# Patient Record
Sex: Female | Born: 1995 | Race: White | Hispanic: No | Marital: Married | Smoking: Never smoker
Health system: Southern US, Community
[De-identification: ages and names within clinical notes are randomized; demographics above are authoritative.]

## PROBLEM LIST (undated history)

## (undated) DIAGNOSIS — J45909 Unspecified asthma, uncomplicated: Secondary | ICD-10-CM

## (undated) DIAGNOSIS — L309 Dermatitis, unspecified: Secondary | ICD-10-CM

## (undated) DIAGNOSIS — L509 Urticaria, unspecified: Secondary | ICD-10-CM

## (undated) DIAGNOSIS — I10 Essential (primary) hypertension: Secondary | ICD-10-CM

## (undated) HISTORY — DX: Urticaria, unspecified: L50.9

## (undated) HISTORY — DX: Dermatitis, unspecified: L30.9

## (undated) HISTORY — PX: BARIATRIC SURGERY: SHX1103

## (undated) HISTORY — DX: Unspecified asthma, uncomplicated: J45.909

---
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
## 2018-10-01 DIAGNOSIS — F411 Generalized anxiety disorder: Secondary | ICD-10-CM | POA: Insufficient documentation

## 2019-05-19 DIAGNOSIS — G932 Benign intracranial hypertension: Secondary | ICD-10-CM | POA: Insufficient documentation

## 2019-11-02 ENCOUNTER — Ambulatory Visit (HOSPITAL_COMMUNITY)
Admission: EM | Admit: 2019-11-02 | Discharge: 2019-11-02 | Disposition: A | Discharge status: Home / Self Care | Payer: BLUE CROSS/BLUE SHIELD

## 2019-11-02 DIAGNOSIS — F411 Generalized anxiety disorder: Secondary | ICD-10-CM | POA: Diagnosis not present
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
## 2019-11-02 NOTE — ED Notes (Signed)
No belongings in the locker
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
## 2019-11-02 NOTE — Progress Notes (Signed)
Jessica Andersen received her AVS, reviewed her appointments and her questions were answered. She was escorted to the lobby. No personal items to retrieved.

## 2019-11-04 ENCOUNTER — Other Ambulatory Visit: Payer: Self-pay

## 2019-11-04 ENCOUNTER — Ambulatory Visit (INDEPENDENT_AMBULATORY_CARE_PROVIDER_SITE_OTHER): Payer: BLUE CROSS/BLUE SHIELD | Admitting: Clinical

## 2019-11-04 DIAGNOSIS — Z87828 Personal history of other (healed) physical injury and trauma: Secondary | ICD-10-CM

## 2019-11-04 DIAGNOSIS — F411 Generalized anxiety disorder: Secondary | ICD-10-CM | POA: Diagnosis not present

## 2019-11-04 DIAGNOSIS — F331 Major depressive disorder, recurrent, moderate: Secondary | ICD-10-CM
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
## 2019-11-18 ENCOUNTER — Ambulatory Visit (INDEPENDENT_AMBULATORY_CARE_PROVIDER_SITE_OTHER): Payer: 59 | Admitting: Clinical

## 2019-11-18 ENCOUNTER — Other Ambulatory Visit: Payer: Self-pay

## 2019-11-18 DIAGNOSIS — F411 Generalized anxiety disorder: Secondary | ICD-10-CM | POA: Diagnosis not present

## 2019-11-18 DIAGNOSIS — Z87828 Personal history of other (healed) physical injury and trauma: Secondary | ICD-10-CM | POA: Diagnosis not present

## 2019-11-18 DIAGNOSIS — F331 Major depressive disorder, recurrent, moderate: Secondary | ICD-10-CM
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
## 2019-11-19 ENCOUNTER — Other Ambulatory Visit: Payer: Self-pay

## 2019-11-19 ENCOUNTER — Telehealth (INDEPENDENT_AMBULATORY_CARE_PROVIDER_SITE_OTHER): Payer: BLUE CROSS/BLUE SHIELD | Admitting: Psychiatry

## 2019-11-19 ENCOUNTER — Encounter (HOSPITAL_COMMUNITY): Payer: Self-pay | Admitting: Psychiatry

## 2019-11-19 DIAGNOSIS — F411 Generalized anxiety disorder: Secondary | ICD-10-CM

## 2019-11-19 DIAGNOSIS — F4001 Agoraphobia with panic disorder: Secondary | ICD-10-CM

## 2019-11-19 DIAGNOSIS — F331 Major depressive disorder, recurrent, moderate: Secondary | ICD-10-CM | POA: Diagnosis not present

## 2019-11-19 DIAGNOSIS — F900 Attention-deficit hyperactivity disorder, predominantly inattentive type: Secondary | ICD-10-CM

## 2019-11-19 DIAGNOSIS — F4310 Post-traumatic stress disorder, unspecified: Secondary | ICD-10-CM | POA: Diagnosis not present

## 2019-11-19 MED ORDER — PRAZOSIN HCL 1 MG PO CAPS
1.0000 mg | ORAL_CAPSULE | Freq: Every day | ORAL | 0 refills | Status: DC
Start: 2019-11-19 — End: 2019-12-16

## 2019-11-19 MED ORDER — DULOXETINE HCL 60 MG PO CPEP
60.0000 mg | ORAL_CAPSULE | Freq: Every day | ORAL | 3 refills | Status: DC
Start: 2019-11-19 — End: 2019-12-16

## 2019-11-19 MED ORDER — ZOLPIDEM TARTRATE 5 MG PO TABS: 5.0000 mg | ORAL_TABLET | Freq: Every evening | ORAL | 0 refills | Status: DC | PRN

## 2019-11-19 MED ORDER — CLONAZEPAM 0.5 MG PO TBDP
0.5000 mg | ORAL_TABLET | Freq: Two times a day (BID) | ORAL | 0 refills | Status: DC | PRN
Start: 2019-11-19 — End: 2019-12-16
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
## 2019-11-24 ENCOUNTER — Telehealth (HOSPITAL_COMMUNITY): Payer: Self-pay | Admitting: *Deleted

## 2019-11-24 NOTE — Telephone Encounter (Signed)
Patient called and stated that the 4 meds you started her on are working great that she is sleeping well and sees other improvements.  She stated that now she has no energy.  Do you want her to cut back on any of them? Please review and advise.

## 2019-11-24 NOTE — Telephone Encounter (Signed)
I think she should try to take only one 60 mg capsule of Cymbalta in the evening and if her energy level improves and mood does not deteriorate we may want to keep it at that dose.

## 2019-11-24 NOTE — Telephone Encounter (Signed)
I will let her know

## 2019-12-02 ENCOUNTER — Other Ambulatory Visit: Payer: Self-pay

## 2019-12-02 ENCOUNTER — Ambulatory Visit (INDEPENDENT_AMBULATORY_CARE_PROVIDER_SITE_OTHER): Payer: Managed Care, Other (non HMO) | Admitting: Clinical

## 2019-12-02 DIAGNOSIS — F4310 Post-traumatic stress disorder, unspecified: Secondary | ICD-10-CM

## 2019-12-02 DIAGNOSIS — F331 Major depressive disorder, recurrent, moderate: Secondary | ICD-10-CM

## 2019-12-02 DIAGNOSIS — F411 Generalized anxiety disorder: Secondary | ICD-10-CM
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
## 2019-12-16 ENCOUNTER — Ambulatory Visit (HOSPITAL_COMMUNITY): Payer: BLUE CROSS/BLUE SHIELD | Admitting: Clinical

## 2019-12-16 ENCOUNTER — Telehealth (INDEPENDENT_AMBULATORY_CARE_PROVIDER_SITE_OTHER): Payer: BLUE CROSS/BLUE SHIELD | Admitting: Psychiatry

## 2019-12-16 ENCOUNTER — Other Ambulatory Visit: Payer: Self-pay

## 2019-12-16 DIAGNOSIS — F4310 Post-traumatic stress disorder, unspecified: Secondary | ICD-10-CM

## 2019-12-16 DIAGNOSIS — F331 Major depressive disorder, recurrent, moderate: Secondary | ICD-10-CM

## 2019-12-16 DIAGNOSIS — F909 Attention-deficit hyperactivity disorder, unspecified type: Secondary | ICD-10-CM | POA: Diagnosis not present

## 2019-12-16 DIAGNOSIS — F411 Generalized anxiety disorder: Secondary | ICD-10-CM

## 2019-12-16 DIAGNOSIS — F4001 Agoraphobia with panic disorder: Secondary | ICD-10-CM

## 2019-12-16 MED ORDER — ZOLPIDEM TARTRATE 10 MG PO TABS
10.0000 mg | ORAL_TABLET | Freq: Every evening | ORAL | 2 refills | Status: DC | PRN
Start: 2019-12-16 — End: 2020-03-21

## 2019-12-16 MED ORDER — CLONAZEPAM 0.5 MG PO TBDP
0.5000 mg | ORAL_TABLET | Freq: Two times a day (BID) | ORAL | 1 refills | Status: DC | PRN
Start: 2019-12-16 — End: 2020-01-19

## 2019-12-16 MED ORDER — PRAZOSIN HCL 1 MG PO CAPS
1.0000 mg | ORAL_CAPSULE | Freq: Every day | ORAL | 2 refills | Status: DC
Start: 2019-12-16 — End: 2020-03-18

## 2019-12-16 MED ORDER — BUPROPION HCL ER (XL) 150 MG PO TB24
150.0000 mg | ORAL_TABLET | Freq: Every day | ORAL | 1 refills | Status: DC
Start: 2019-12-16 — End: 2020-01-19

## 2019-12-16 MED ORDER — DULOXETINE HCL 60 MG PO CPEP
120.0000 mg | ORAL_CAPSULE | Freq: Every day | ORAL | 2 refills | Status: DC
Start: 2019-12-16 — End: 2020-03-18
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
## 2019-12-17 ENCOUNTER — Ambulatory Visit (INDEPENDENT_AMBULATORY_CARE_PROVIDER_SITE_OTHER): Payer: BLUE CROSS/BLUE SHIELD | Admitting: Clinical

## 2019-12-17 DIAGNOSIS — F411 Generalized anxiety disorder: Secondary | ICD-10-CM | POA: Diagnosis not present

## 2019-12-17 DIAGNOSIS — F331 Major depressive disorder, recurrent, moderate: Secondary | ICD-10-CM

## 2019-12-17 DIAGNOSIS — F4310 Post-traumatic stress disorder, unspecified: Secondary | ICD-10-CM | POA: Diagnosis not present
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
## 2019-12-23 ENCOUNTER — Ambulatory Visit (HOSPITAL_COMMUNITY): Payer: BLUE CROSS/BLUE SHIELD | Admitting: Clinical

## 2019-12-30 ENCOUNTER — Ambulatory Visit (INDEPENDENT_AMBULATORY_CARE_PROVIDER_SITE_OTHER): Payer: 59 | Admitting: Clinical

## 2019-12-30 DIAGNOSIS — F411 Generalized anxiety disorder: Secondary | ICD-10-CM

## 2019-12-30 DIAGNOSIS — F4310 Post-traumatic stress disorder, unspecified: Secondary | ICD-10-CM | POA: Diagnosis not present

## 2019-12-30 DIAGNOSIS — F331 Major depressive disorder, recurrent, moderate: Secondary | ICD-10-CM | POA: Diagnosis not present

## 2019-12-30 NOTE — Progress Notes (Signed)
   THERAPIST PROGRESS NOTE  Session Time: 10am  Participation Level: Active  Behavioral Response: Unknown appearanceAlertAnxious and Depressed  Type of Therapy: Individual Therapy  Virtual Visit via Telephone Note  I connected with Jessica Andersen on 12/30/19 at 10:00 AM EST by telephone and verified that I am speaking with the correct person using two identifiers.  Location: Patient: Home Provider: Pt office   I discussed the limitations, risks, security and privacy concerns of performing an evaluation and management service by telephone and the availability of in person appointments. I also discussed with the patient that there may be a patient responsible charge related to this service. The patient expressed understanding and agreed to proceed.   I discussed the assessment and treatment plan with the patient. The patient was provided an opportunity to ask questions and all were answered. The patient agreed with the plan and demonstrated an understanding of the instructions.   The patient was advised to call back or seek an in-person evaluation if the symptoms worsen or if the condition fails to improve as anticipated.  I provided 45 minutes of non-face-to-face time during this encounter.  Treatment Goals addressed: Diagnosis  Interventions: Strength-based, Reframing and Other: psychoeducation  Summary: Jessica Andersen is a 24 y.o. female who presents with a history of depression, anxiety and trauma. During session, pt required a great deal or redirection as a she appeared to be distracted by caring for child. CSW addressed this and discussed with pt scheduling appointment during time where she has fewer distractions. Pt reports on days when she is feeling very overwhelmed, like today, she will ask her grandmother to assist her during her appointment time. Pt admits having difficulty adjusting to being a parent and discussed having no stress outlets. Todays session, consisted of pt  discussing her parenting challenges and addressing changes in her mood and behavior. Pt endorses "feeling alone, hopeless, emptiness and difficulty falling and staying asleep." Pt also reports she has been experiencing difficulty remembering to take her medication as prescribed. Pt states she is scheduled to have a spinal tap appointment on 11/23 and discussed having brain complication.   Suicidal/Homicidal: Pt reports she has been experiencing fleeting suicidal ideation but denies a plan or intent to harm herself. Pt reports the love of her family prevents her from acting on her thoughts. Pt encouraged to call 911 or go to closest emergency department in the event of an emergency. Pt reminded of the suicide prevention lifeline and encouraged to call if she experiences mental health emergency.  Therapist Response: Pt educated on mental health diagnosis and sxs associated with diagnosis. Discussed with pt potential benefits of participating in a mental health or parent support group to address lack of supports. Pt provided with information of mental health association of [place], triad mom groups and [facility] [place]. CSW demonstrated how to reframe negative thinking and role played with pt to assess her understanding of the skill. Pt encouraged to utilize community resources in the event of an emergency. CSW prompted pt to identify her strengths when she minimized many of her talents and abilities.  Plan: Return again in 2 weeks.  Diagnosis: Axis I: Major depressive disorder, recurrent, moderate Generalized anxiety disorder Post traumatic stress disorder    Axis II: No diagnosis    Jessica Slick, LCSW 12/30/2019

## 2020-01-06 ENCOUNTER — Ambulatory Visit (HOSPITAL_COMMUNITY): Payer: BLUE CROSS/BLUE SHIELD | Admitting: Clinical

## 2020-01-13 ENCOUNTER — Ambulatory Visit (INDEPENDENT_AMBULATORY_CARE_PROVIDER_SITE_OTHER): Payer: 59 | Admitting: Clinical

## 2020-01-13 ENCOUNTER — Other Ambulatory Visit: Payer: Self-pay

## 2020-01-13 DIAGNOSIS — F331 Major depressive disorder, recurrent, moderate: Secondary | ICD-10-CM

## 2020-01-13 DIAGNOSIS — F411 Generalized anxiety disorder: Secondary | ICD-10-CM

## 2020-01-13 DIAGNOSIS — F4310 Post-traumatic stress disorder, unspecified: Secondary | ICD-10-CM | POA: Diagnosis not present
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
## 2020-01-19 ENCOUNTER — Telehealth (INDEPENDENT_AMBULATORY_CARE_PROVIDER_SITE_OTHER): Payer: BLUE CROSS/BLUE SHIELD | Admitting: Psychiatry

## 2020-01-19 ENCOUNTER — Other Ambulatory Visit: Payer: Self-pay

## 2020-01-19 DIAGNOSIS — F330 Major depressive disorder, recurrent, mild: Secondary | ICD-10-CM | POA: Diagnosis not present

## 2020-01-19 DIAGNOSIS — F411 Generalized anxiety disorder: Secondary | ICD-10-CM | POA: Diagnosis not present

## 2020-01-19 DIAGNOSIS — F909 Attention-deficit hyperactivity disorder, unspecified type: Secondary | ICD-10-CM

## 2020-01-19 DIAGNOSIS — F4001 Agoraphobia with panic disorder: Secondary | ICD-10-CM

## 2020-01-19 DIAGNOSIS — F4310 Post-traumatic stress disorder, unspecified: Secondary | ICD-10-CM

## 2020-01-19 DIAGNOSIS — F331 Major depressive disorder, recurrent, moderate: Secondary | ICD-10-CM

## 2020-01-19 MED ORDER — OXCARBAZEPINE 150 MG PO TABS: 150.0000 mg | ORAL_TABLET | Freq: Two times a day (BID) | ORAL | 1 refills | Status: DC

## 2020-01-19 MED ORDER — BUPROPION HCL ER (XL) 150 MG PO TB24
150.0000 mg | ORAL_TABLET | Freq: Every day | ORAL | 2 refills | Status: DC
Start: 2020-02-15 — End: 2020-03-21

## 2020-01-19 MED ORDER — CLONAZEPAM 0.5 MG PO TBDP
0.5000 mg | ORAL_TABLET | Freq: Two times a day (BID) | ORAL | 1 refills | Status: DC | PRN
Start: 2020-02-15 — End: 2020-03-21
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
## 2020-01-20 ENCOUNTER — Ambulatory Visit (HOSPITAL_COMMUNITY): Payer: BLUE CROSS/BLUE SHIELD | Admitting: Clinical

## 2020-01-20 NOTE — Progress Notes (Unsigned)
Pt canceled today's appointment and request to have the appointment rescheduled.

## 2020-02-23 ENCOUNTER — Other Ambulatory Visit: Payer: Self-pay

## 2020-02-23 ENCOUNTER — Telehealth (HOSPITAL_COMMUNITY): Payer: BLUE CROSS/BLUE SHIELD | Admitting: Psychiatry

## 2020-03-09 ENCOUNTER — Encounter (HOSPITAL_COMMUNITY): Payer: Self-pay

## 2020-03-09 ENCOUNTER — Emergency Department (HOSPITAL_COMMUNITY): Payer: BLUE CROSS/BLUE SHIELD

## 2020-03-09 ENCOUNTER — Other Ambulatory Visit: Payer: Self-pay

## 2020-03-09 DIAGNOSIS — R079 Chest pain, unspecified: Secondary | ICD-10-CM | POA: Diagnosis not present

## 2020-03-09 DIAGNOSIS — Z5321 Procedure and treatment not carried out due to patient leaving prior to being seen by health care provider: Secondary | ICD-10-CM | POA: Insufficient documentation

## 2020-03-09 NOTE — ED Triage Notes (Signed)
Patient from home with chest pain, reports pain is intermittent, is currently on a holter monitor for same, EMS reports she was pain free entire ride then developed pain on arrival.  EMS 160/113 115 HR 20 RR 97% RA

## 2020-03-10 ENCOUNTER — Emergency Department (HOSPITAL_COMMUNITY)
Admission: EM | Admit: 2020-03-10 | Discharge: 2020-03-10 | Disposition: A | Discharge status: Home / Self Care | Payer: BLUE CROSS/BLUE SHIELD | Attending: Emergency Medicine | Admitting: Emergency Medicine

## 2020-03-10 LAB — BASIC METABOLIC PANEL
Anion gap: 10 (ref 5–15)
BUN: 8 mg/dL (ref 6–20)
CO2: 24 mmol/L (ref 22–32)
Calcium: 9.7 mg/dL (ref 8.9–10.3)
Chloride: 104 mmol/L (ref 98–111)
Creatinine, Ser: 0.65 mg/dL (ref 0.44–1.00)
GFR, Estimated: >60 mL/min (ref >60)
Glucose, Bld: 92 mg/dL (ref 70–99)
Potassium: 3.8 mmol/L (ref 3.5–5.1)
Sodium: 138 mmol/L (ref 135–145)

## 2020-03-10 LAB — CBC
HCT: 44.7 % (ref 36.0–46.0)
Hemoglobin: 14.6 g/dL (ref 12.0–15.0)
MCH: 27.8 pg (ref 26.0–34.0)
MCHC: 32.7 g/dL (ref 30.0–36.0)
MCV: 85 fL (ref 80.0–100.0)
Platelets: 300 10*3/uL (ref 150–400)
RBC: 5.26 MIL/uL — ABNORMAL HIGH (ref 3.87–5.11)
RDW: 13.5 % (ref 11.5–15.5)
WBC: 15.6 10*3/uL — ABNORMAL HIGH (ref 4.0–10.5)
nRBC: 0 % (ref 0.0–0.2)

## 2020-03-10 LAB — TROPONIN I (HIGH SENSITIVITY): Troponin I (High Sensitivity): 5 ng/L (ref <18)

## 2020-03-10 LAB — I-STAT BETA HCG BLOOD, ED (MC, WL, AP ONLY): I-stat hCG, quantitative: <5 m[IU]/mL (ref <5)

## 2020-03-17 ENCOUNTER — Telehealth (HOSPITAL_COMMUNITY): Payer: Self-pay | Admitting: *Deleted

## 2020-03-17 NOTE — Telephone Encounter (Signed)
Pt called requesting refills and transfer to new pharmacy, have added to snapshot, in [place]. Writer advised we can do that however pt needs to make an appointment as she was no show for last two appointments and has controls to fill. Pt now has an appointment on 03/21/20. Do you advise refilling or wait until appointment in 3 days. Didn't expect appointment so soon.

## 2020-03-18 ENCOUNTER — Other Ambulatory Visit (HOSPITAL_COMMUNITY): Payer: Self-pay | Admitting: Psychiatry

## 2020-03-18 MED ORDER — OXCARBAZEPINE 150 MG PO TABS: 150.0000 mg | ORAL_TABLET | Freq: Two times a day (BID) | ORAL | 1 refills | Status: DC

## 2020-03-18 MED ORDER — PRAZOSIN HCL 1 MG PO CAPS: 1.0000 mg | ORAL_CAPSULE | Freq: Every day | ORAL | 2 refills | Status: DC

## 2020-03-18 MED ORDER — DULOXETINE HCL 60 MG PO CPEP: 120.0000 mg | ORAL_CAPSULE | Freq: Every day | ORAL | 2 refills | Status: DC

## 2020-03-18 NOTE — Telephone Encounter (Signed)
Given hx of no shows I'd rather wait till Monday with any refills.

## 2020-03-21 ENCOUNTER — Other Ambulatory Visit: Payer: Self-pay

## 2020-03-21 ENCOUNTER — Telehealth (INDEPENDENT_AMBULATORY_CARE_PROVIDER_SITE_OTHER): Payer: BLUE CROSS/BLUE SHIELD | Admitting: Psychiatry

## 2020-03-21 DIAGNOSIS — F4001 Agoraphobia with panic disorder: Secondary | ICD-10-CM

## 2020-03-21 DIAGNOSIS — F330 Major depressive disorder, recurrent, mild: Secondary | ICD-10-CM

## 2020-03-21 DIAGNOSIS — F4310 Post-traumatic stress disorder, unspecified: Secondary | ICD-10-CM

## 2020-03-21 DIAGNOSIS — F411 Generalized anxiety disorder: Secondary | ICD-10-CM | POA: Diagnosis not present

## 2020-03-21 MED ORDER — BUPROPION HCL ER (XL) 150 MG PO TB24: 150.0000 mg | ORAL_TABLET | Freq: Every day | ORAL | 2 refills | Status: DC

## 2020-03-21 MED ORDER — OXCARBAZEPINE 300 MG PO TABS: 300.0000 mg | ORAL_TABLET | Freq: Two times a day (BID) | ORAL | 2 refills | Status: DC

## 2020-03-21 MED ORDER — ZOLPIDEM TARTRATE 10 MG PO TABS: 10.0000 mg | ORAL_TABLET | Freq: Every evening | ORAL | 2 refills | Status: DC | PRN

## 2020-03-21 MED ORDER — CLONAZEPAM 0.5 MG PO TBDP: 0.5000 mg | ORAL_TABLET | Freq: Two times a day (BID) | ORAL | 2 refills | Status: AC | PRN
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
## 2020-05-20 ENCOUNTER — Other Ambulatory Visit (HOSPITAL_COMMUNITY): Payer: Self-pay | Admitting: Psychiatry

## 2020-06-06 ENCOUNTER — Telehealth (HOSPITAL_COMMUNITY): Payer: BLUE CROSS/BLUE SHIELD | Admitting: Family

## 2020-11-03 ENCOUNTER — Telehealth (HOSPITAL_COMMUNITY): Payer: Self-pay | Admitting: *Deleted

## 2020-11-03 NOTE — Telephone Encounter (Signed)
Writer returned pt call regarding medication refills. She is a former pt of Dr. Hinton Dyer. Pt has not been seen since 03/21/20 after several no shows. Pt was seen by [facility] on 08/03/20 and referred to [facility]. Pt has been out of medications for at least 4 months and has multiple medical issues as well. Provider here is not comfortable refilling pt meds with this history and not having been since in almost 10 months. Writer gave pt information about [facility] as well as [facility]. Pt receptive and verbalizes understanding.

## 2021-10-19 IMAGING — CR DG CHEST 2V
2 series · 2 of 2 positions shown · non-contrast
Comparison: None.

CLINICAL DATA: Chest pain

EXAM:
CHEST - 2 VIEW

[chest pa]
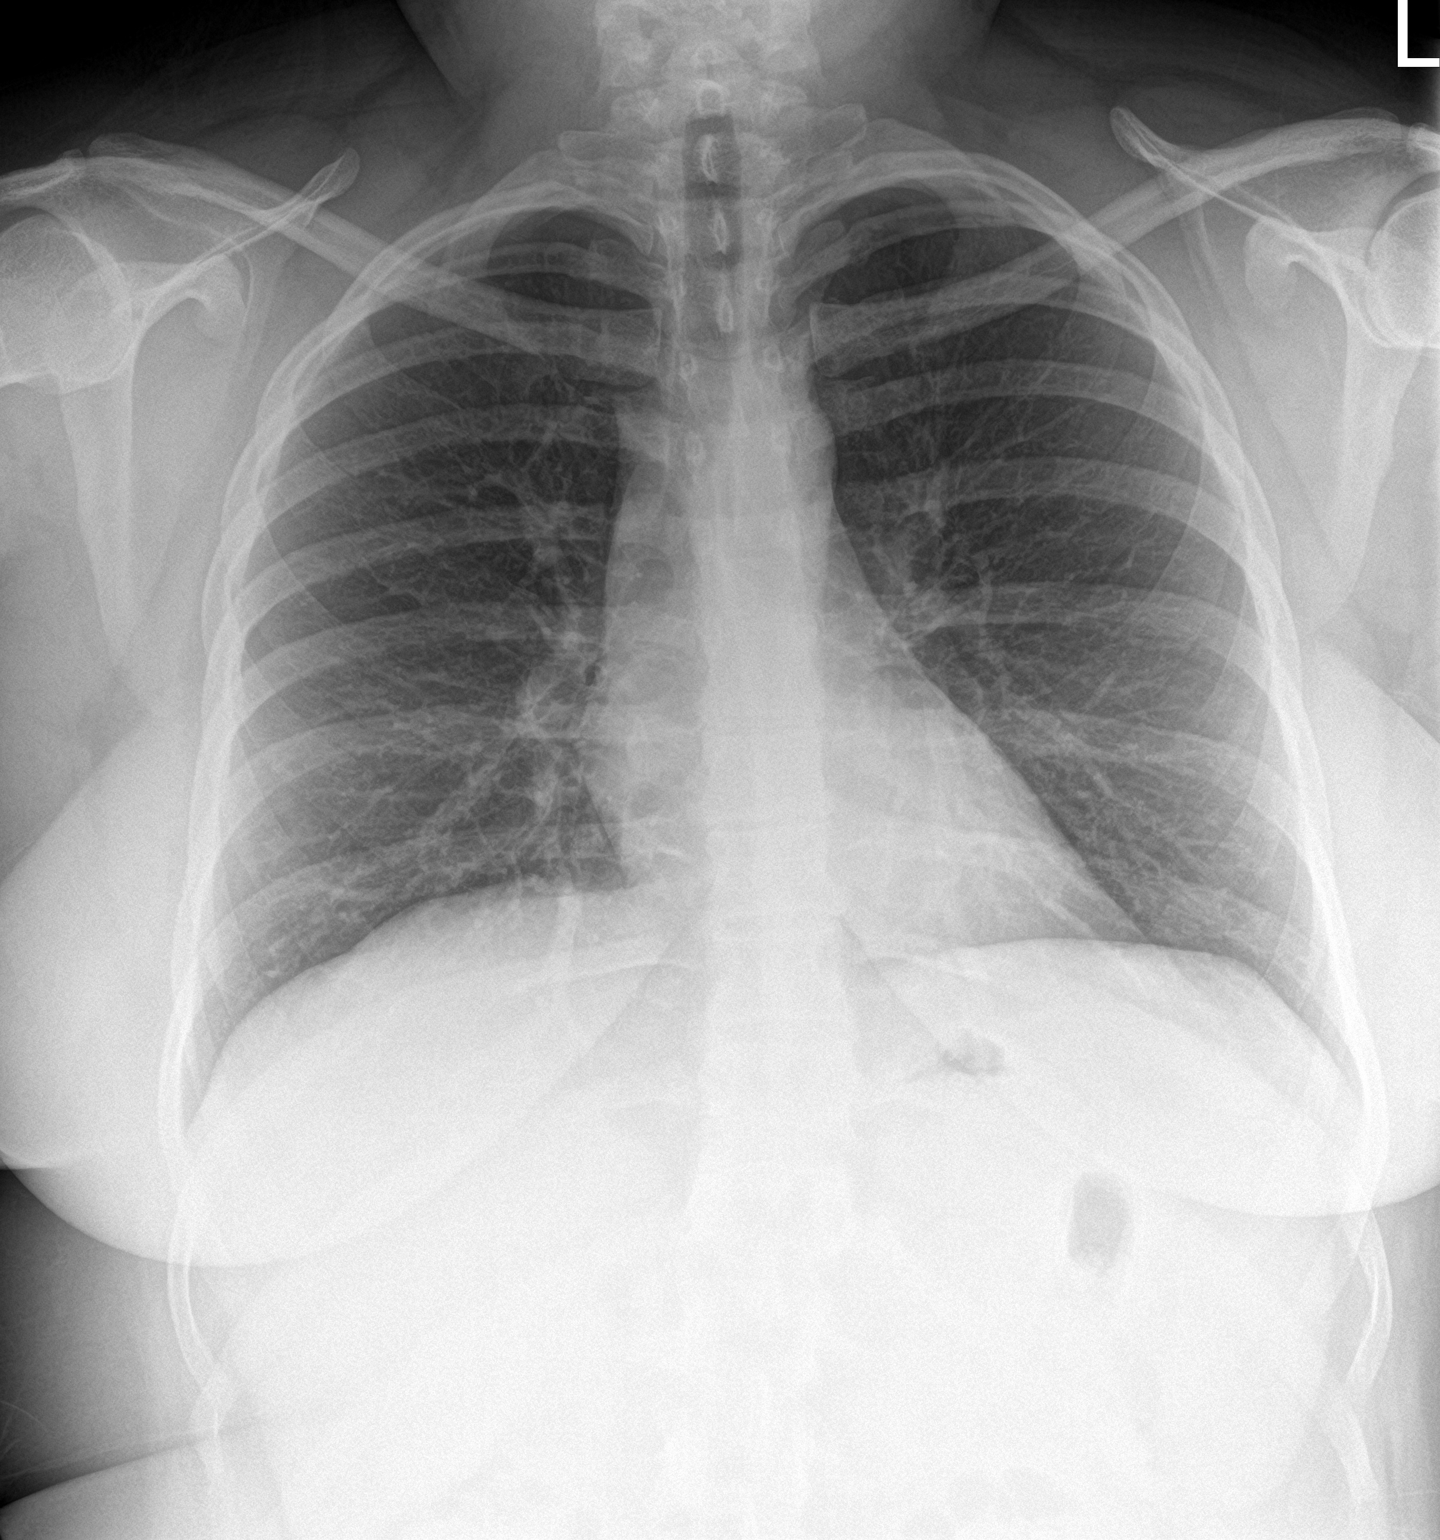

[chest lat]
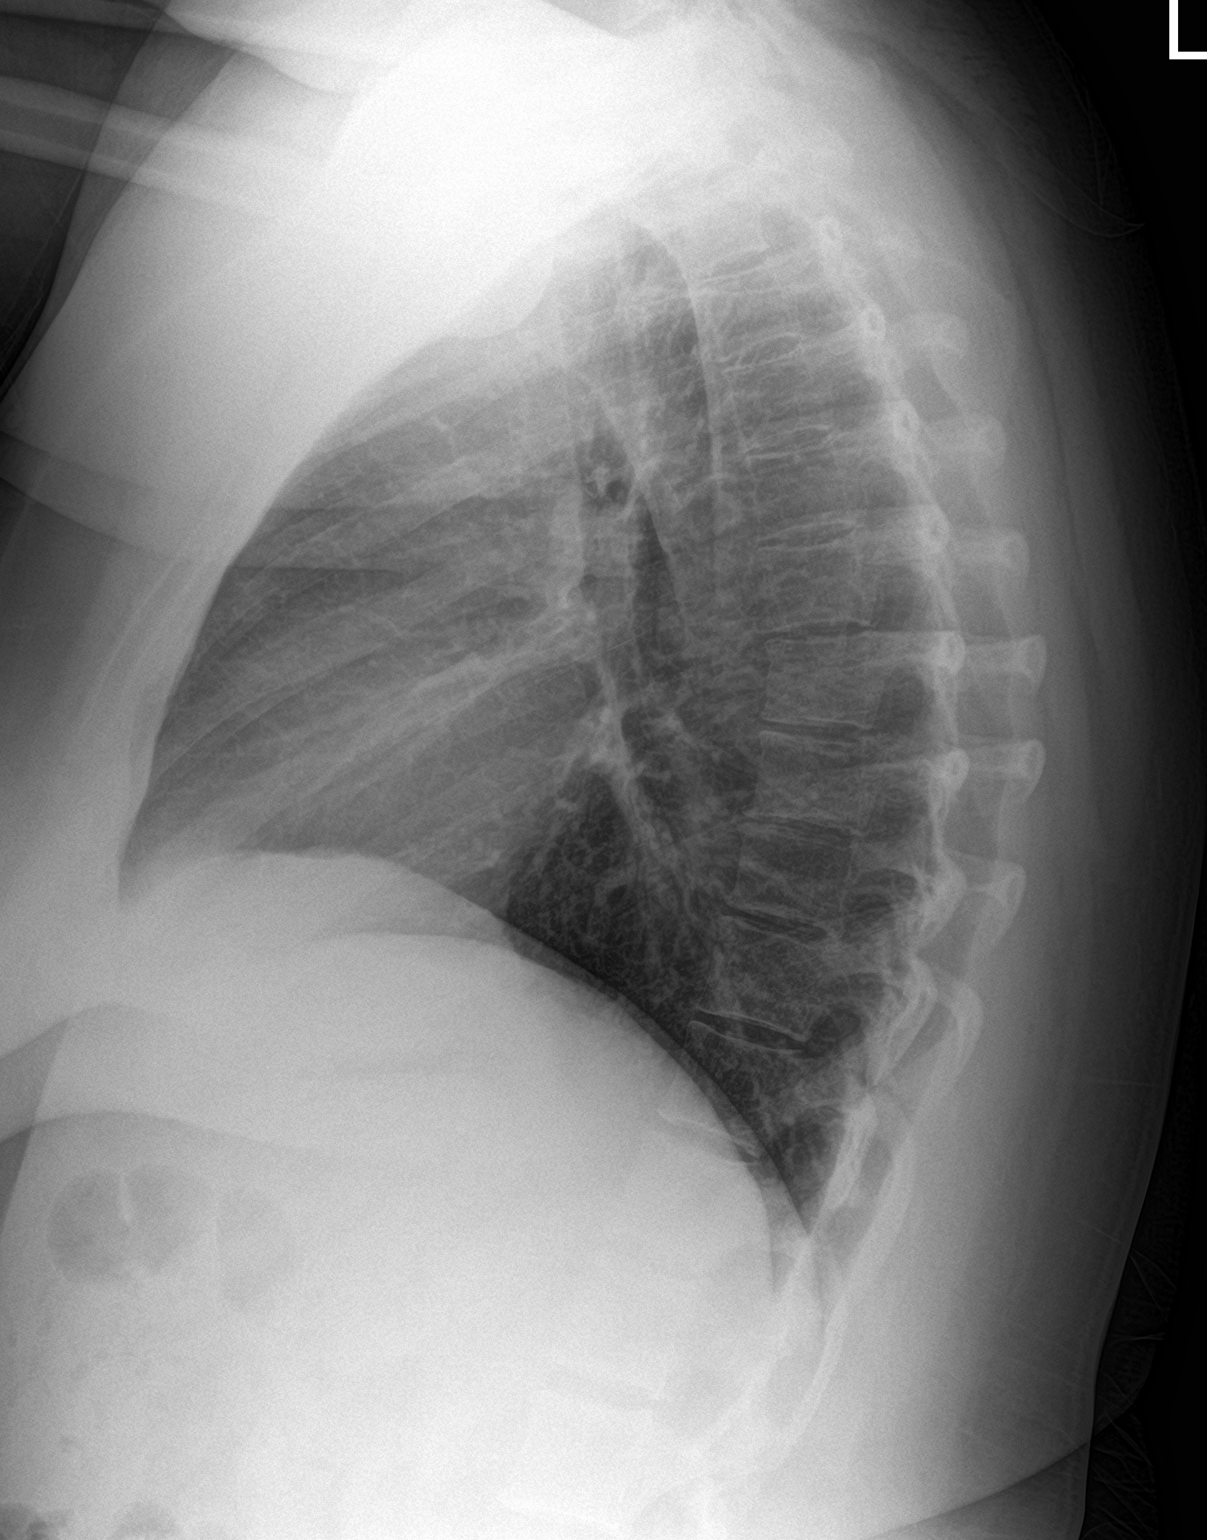

[2 of 2 positions shown; findings below may reference images not displayed]

FINDINGS: The heart size and mediastinal contours are within normal limits.
Both lungs are clear. The visualized skeletal structures are
unremarkable.
IMPRESSION: No active cardiopulmonary disease.

## 2022-02-26 NOTE — Progress Notes (Unsigned)
New Patient Note  RE: Jessica Andersen MRN: [id number] DOB: May 01, 1995 Date of Office Visit: 02/27/2022  Consult requested by: Loleta Dicker, FNP Primary care provider: Patient, No Pcp Per  Chief Complaint: Urticaria (Last allergist stopped doing xolair for hives and had trouble getting it. Wants to get a urine test to test for mold to see how bad it is. )  History of Present Illness: I had the pleasure of seeing Jessica Andersen for initial evaluation at the [facility] on 02/27/2022. She is a 27 y.o. female, who is referred here by Berton Bon, FNP for the evaluation of urticaria. She is accompanied today by her husband who provided/contributed to the history.   Patient was being followed by [facility] and she was on Xolair 300mg  every 4 weeks.  She was started last year and her prescription ran out 3 months ago. She had milder symptoms than before since she has been off it.   Rash started about 20+ years ago. This can occur anywhere on her body. Describes them as itchy, red, raised. Individual rashes lasts about 1-2 days. Sometimes they left some discoloration. Associated symptoms include: none.  Frequency of episodes: she used to break out on a daily basis. Now she has 1-2 hives once per week. Suspected triggers are unknown. Denies any fevers, chills, changes in medications, foods, personal care products or recent infections. She has tried the following therapies: prednisone with good benefit. Patient tried Singulair, famotidine, antihistamines with no benefit.  Systemic steroids yes. Currently on one daily antihistamine.  Previous work up includes: she had skin testing for environmental, food - which was positive to pollen only per patient report. No recent bloodwork.  No prior dermatology visit - no prior skin biopsy.  Patient is up to date with the following cancer screening tests: physical exam, pap smears.  Reviewed images on the phone - consistent with  hives.   09/02/2020 ER visit: " Allergic Reaction-Major  unknown allergen. pt has had 3 benadryl 2 hrs ago. pt stated is having difficulty swallowing, hives all over. swelling   Patient is a 27 year old female with past medical history of depression, asthma, idiopathic intracranial hypertension who presents with allergic reaction. Patient reports she has had worsening swelling and hives of her hands for the past month. Patient states that today it progressed and she now has some voice changes and some difficulty breathing. Patient also reports mild difficulty swallowing. Patient reports no known allergic triggers. Patient reports no fevers, chills, headache, chest pain, abdominal pain, nausea, vomiting. Patient reports no diarrhea. Patient reports no falls or injuries. Patient reports taking Benadryl at home without effect. Patient does note that she has been on steroids and took her last dose of prednisone today."  Assessment and Plan: Jessica Andersen is a 27 y.o. female with: Chronic urticaria Hives for 20+ years. Last year she was worked up by 23 and was started on Xolair 300mg  every 4 weeks with good benefit. Last injection was about 3 months ago and noticing some milder symptoms 1-2 times per week. Patient would like to restart Xolair. Tried OTC antihistamines, Singulair and famotidine with no benefit. Only prednisone helped in the past.  Requesting records from prior allergist. Will start PA restart Xolair 300mg  every 4 week injections. Start zyrtec (cetirizine) 10mg  or allegra 180mg  once a day and may take twice a day during flares.  If symptoms are not controlled or causes drowsiness let [employer] know. Avoid the following potential  triggers: alcohol, tight clothing, NSAIDs, hot showers and getting overheated. Get bloodwork - once I reviewed your records. Will mail lab orders to your home.   Other allergic rhinitis Some mild rhinoconjunctivitis symptoms and takes over-the-counter  antihistamines with good benefit.  Skin testing in the past was positive to pollen per patient report. Requesting records of last allergy test.  Start environmental control measures as below - for pollen.  Use over the counter antihistamines such as Zyrtec (cetirizine), Claritin (loratadine), Allegra (fexofenadine), or Xyzal (levocetirizine) daily as needed. May take twice a day during allergy flares. May switch antihistamines every few months. Start Ryaltris (olopatadine + mometasone nasal spray combination) 1-2 sprays per nostril twice a day. Sample given. This replaces your other nasal sprays. If this works well for you, then have [facility] ship the medication to your home - prescription already sent in.   Mild intermittent asthma without complication Diagnosed with asthma as a young child. Main triggers: dust and exertion. Uses albuterol less than once per month. Today's spirometry was normal. May use albuterol rescue inhaler 2 puffs every 4 to 6 hours as needed for shortness of breath, chest tightness, coughing, and wheezing. May use albuterol rescue inhaler 2 puffs 5 to 15 minutes prior to strenuous physical activities. Monitor frequency of use.   Return in about 3 months (around 05/29/2022).  Meds ordered this encounter  Medications   albuterol (VENTOLIN HFA) 108 (90 Base) MCG/ACT inhaler    Sig: Inhale 2 puffs into the lungs every 4 (four) hours as needed for wheezing or shortness of breath (coughing fits).    Dispense:  18 g    Refill:  1   Olopatadine-Mometasone (RYALTRIS) 665-25 MCG/ACT SUSP    Sig: Place 1-2 sprays into the nose in the morning and at bedtime.    Dispense:  29 g    Refill:  5    [phone]   Lab Orders  No laboratory test(s) ordered today    Other allergy screening: Asthma: yes Asthma since a child. Mainly triggered by exertion and dust. Uses albuterol less than once per month with good benefit.   Rhino conjunctivitis: yes Some itchy nose, sneezing,  rhinorrhea, itchy eyes. Takes OTC antihistamines prn with good benefit.  Skin testing in the past was positive to pollen per patient report.   Food allergy: no Medication allergy: yes Hymenoptera allergy: no Eczema: slightly on arms.  History of recurrent infections suggestive of immunodeficency: no  Diagnostics: Spirometry:  Tracings reviewed. Her effort: Good reproducible efforts. FVC: 4.60L FEV1: 3.57L, 117% predicted FEV1/FVC ratio: 78% Interpretation: Spirometry consistent with normal pattern.  Please see scanned spirometry results for details.   Past Medical History: Patient Active Problem List   Diagnosis Date Noted   Chronic urticaria 02/27/2022   Other allergic rhinitis 02/27/2022   Mild intermittent asthma without complication 02/27/2022   PTSD (post-traumatic stress disorder) 11/19/2019   Panic disorder with agoraphobia 11/19/2019   Benign intracranial hypertension 05/19/2019   GAD (generalized anxiety disorder) 10/01/2018   Morbid obesity due to excess calories (HCC) 10/01/2018   Major depressive disorder, recurrent episode, mild (HCC) 06/24/2015   Chronic migraine 04/26/2014   Attention-deficit hyperactivity disorder, unspecified type 11/13/2011   Past Medical History:  Diagnosis Date   Asthma    Eczema    Urticaria    Past Surgical History: History reviewed. No pertinent surgical history. Medication List:  Current Outpatient Medications  Medication Sig Dispense Refill   acetaminophen (TYLENOL) 500 MG tablet Take by mouth.  albuterol (VENTOLIN HFA) 108 (90 Base) MCG/ACT inhaler Inhale 2 puffs into the lungs every 4 (four) hours as needed for wheezing or shortness of breath (coughing fits). 18 g 1   baclofen (LIORESAL) 10 MG tablet Take by mouth.     BOTOX 200 units injection Provider to inject two hundred (200) units into the muscle every 3 months per PREEMPT protocol for chronic migraine.     clindamycin (CLINDAGEL) 1 % gel Apply topically.      fluticasone (FLOVENT HFA) 220 MCG/ACT inhaler Inhale into the lungs.     gabapentin (NEURONTIN) 100 MG capsule Take by mouth.     hydrocortisone 2.5 % cream Apply a small amount to affected area twice a day as needed     lisinopril (ZESTRIL) 5 MG tablet Take 5 mg by mouth daily.     metoCLOPramide (REGLAN) 10 MG tablet Take by mouth.     Olopatadine-Mometasone (RYALTRIS) [id number] MCG/ACT SUSP Place 1-2 sprays into the nose in the morning and at bedtime. 29 g 5   omeprazole (PRILOSEC) 40 MG capsule Take by mouth.     orphenadrine (NORFLEX) 100 MG tablet Take by mouth.     OXcarbazepine (TRILEPTAL PO) Take by mouth.     terbinafine (LAMISIL) 250 MG tablet Take 250 mg by mouth daily.     traZODone (DESYREL) 50 MG tablet Take 50 mg by mouth at bedtime.     UBRELVY 100 MG TABS Take 1 tablet by mouth 2 (two) times daily as needed.     Vitamin D, Ergocalciferol, 50000 units CAPS Take 1 capsule by mouth 2 (two) times a week.     buPROPion (WELLBUTRIN XL) 150 MG 24 hr tablet Take 1 tablet (150 mg total) by mouth daily. 30 tablet 2   clonazePAM (KLONOPIN) 0.5 MG disintegrating tablet Take 1 tablet (0.5 mg total) by mouth 2 (two) times daily as needed (anxiety). 60 tablet 2   DULoxetine (CYMBALTA) 60 MG capsule Take 2 capsules (120 mg total) by mouth at bedtime. 60 capsule 2   Oxcarbazepine (TRILEPTAL) 300 MG tablet Take 1 tablet (300 mg total) by mouth 2 (two) times daily. 60 tablet 2   prazosin (MINIPRESS) 1 MG capsule Take 1 capsule (1 mg total) by mouth at bedtime. 30 capsule 2   zolpidem (AMBIEN) 10 MG tablet Take 1 tablet (10 mg total) by mouth at bedtime as needed for sleep. 30 tablet 2   No current facility-administered medications for this visit.   Allergies: Allergies  Allergen Reactions   Other Hives    Environmental (Trees, grasses, pollen).   Baby Wipes Other (See Comments)    Burning sensation   Dexamethasone Other (See Comments)    "extreme burning sensation"   Icy Hot Hives    Penicillins Hives   Wound Dressings Hives    Super duty band-aid adhesive, not allergic to regular adhesive   Social History: Social History   Socioeconomic History   Marital status: Married    Spouse name: Not on file   Number of children: 1   Years of education: Not on file   Highest education level: Not on file  Occupational History   Not on file  Tobacco Use   Smoking status: Never   Smokeless tobacco: Never  Vaping Use   Vaping Use: Never used  Substance and Sexual Activity   Alcohol use: Not on file    Comment: social drinker   Drug use: Never   Sexual activity: Not on file  Other  Topics Concern   Not on file  Social History Narrative   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   Social Determinants of Health   Financial Resource Strain: Not on file  Food Insecurity: Not on file  Transportation Needs: Not on file  Physical Activity: Not on file  Stress: Not on file  Social Connections: Not on file   Lives in a house which was [place] in [place]. Smoking: denies Occupation: not employed  Environmental HistoryFreight forwarder in the house: yes Carpet in the family room: no Carpet in the bedroom: no Heating:  oil Cooling: central Pet: yes 6 cats, 1 dog  Family History: Family History  Problem Relation Age of Onset   Drug abuse Mother    Urticaria Brother    Asthma Brother    Eczema Cousin    Asthma Cousin    Review of Systems  Constitutional:  Negative for appetite change, chills, fever and unexpected weight change.  HENT:  Positive for congestion. Negative for rhinorrhea.   Eyes:  Negative for itching.  Respiratory:  Negative for cough, chest tightness, shortness of breath and wheezing.   Cardiovascular:  Negative for chest pain.  Gastrointestinal:  Negative for abdominal pain.  Genitourinary:  Negative for difficulty urinating.  Skin:  Positive for rash.  Allergic/Immunologic: Positive for environmental allergies. Negative for food allergies.  Neurological:   Positive for headaches.    Objective: BP 108/62   Pulse 88   Temp 98 F (36.7 C)   Resp 16   Ht 5' 2.5" (1.588 m)   Wt 195 lb 8 oz (88.7 kg)   SpO2 98%   BMI 35.19 kg/m  Body mass index is 35.19 kg/m. Physical Exam Vitals and nursing note reviewed.  Constitutional:      Appearance: Normal appearance. She is well-developed.  HENT:     Head: Normocephalic and atraumatic.     Right Ear: Tympanic membrane and external ear normal.     Left Ear: Tympanic membrane and external ear normal.     Nose: Congestion present.     Mouth/Throat:     Mouth: Mucous membranes are moist.     Pharynx: Oropharynx is clear.  Eyes:     Conjunctiva/sclera: Conjunctivae normal.  Cardiovascular:     Rate and Rhythm: Normal rate and regular rhythm.     Heart sounds: Normal heart sounds. No murmur heard.    No friction rub. No gallop.  Pulmonary:     Effort: Pulmonary effort is normal.     Breath sounds: Normal breath sounds. No wheezing, rhonchi or rales.  Musculoskeletal:     Cervical back: Neck supple.  Skin:    General: Skin is warm.     Findings: No rash.  Neurological:     Mental Status: She is alert and oriented to person, place, and time.  Psychiatric:        Behavior: Behavior normal.    The plan was reviewed with the patient/family, and all questions/concerned were addressed.  It was my pleasure to see Almas today and participate in her care. Please feel free to contact me with any questions or concerns.  Sincerely,  Rexene Alberts, DO Allergy & Immunology  [facility] office: [place] office: [phone]

## 2022-02-27 ENCOUNTER — Encounter: Payer: Self-pay | Admitting: Allergy

## 2022-02-27 ENCOUNTER — Ambulatory Visit (INDEPENDENT_AMBULATORY_CARE_PROVIDER_SITE_OTHER): Payer: BC Managed Care – PPO | Admitting: Allergy

## 2022-02-27 VITALS — BP 108/62 | HR 88 | Temp 98.0°F | Resp 16 | Ht 62.5 in | Wt 195.5 lb

## 2022-02-27 DIAGNOSIS — L508 Other urticaria: Secondary | ICD-10-CM | POA: Insufficient documentation

## 2022-02-27 DIAGNOSIS — J3089 Other allergic rhinitis: Secondary | ICD-10-CM | POA: Diagnosis not present

## 2022-02-27 DIAGNOSIS — J4520 Mild intermittent asthma, uncomplicated: Secondary | ICD-10-CM | POA: Diagnosis not present

## 2022-02-27 MED ORDER — ALBUTEROL SULFATE HFA 108 (90 BASE) MCG/ACT IN AERS: 2.0000 | INHALATION_SPRAY | RESPIRATORY_TRACT | 1 refills | Status: DC | PRN

## 2022-02-27 MED ORDER — RYALTRIS 665-25 MCG/ACT NA SUSP: 1.0000 | Freq: Two times a day (BID) | NASAL | 5 refills | Status: DC

## 2022-02-27 NOTE — Assessment & Plan Note (Signed)
Diagnosed with asthma as a young child. Main triggers: dust and exertion. Uses albuterol less than once per month. Today's spirometry was normal. May use albuterol rescue inhaler 2 puffs every 4 to 6 hours as needed for shortness of breath, chest tightness, coughing, and wheezing. May use albuterol rescue inhaler 2 puffs 5 to 15 minutes prior to strenuous physical activities. Monitor frequency of use.

## 2022-02-27 NOTE — Patient Instructions (Addendum)
Requesting records from your prior allergist. Will start paperwork to restart Xolair injections. Tammy will be in touch with you regarding this.   Hives: Start zyrtec (cetirizine) 10mg  or allegra 180mg  once a day and may take twice a day during flares.  If symptoms are not controlled or causes drowsiness let us know. Avoid the following potential triggers: alcohol, tight clothing, NSAIDs, hot showers and getting overheated.  Get bloodwork - once I reviewed your records. Will mail lab orders to your home.  We are ordering labs, so please allow 1-2 weeks for the results to come back. With the newly implemented Cures Act, the labs might be visible to you at the same time that they become visible to me. However, I will not address the results until all of the results are back, so please be patient.    Asthma Normal breathing test today. May use albuterol rescue inhaler 2 puffs every 4 to 6 hours as needed for shortness of breath, chest tightness, coughing, and wheezing. May use albuterol rescue inhaler 2 puffs 5 to 15 minutes prior to strenuous physical activities. Monitor frequency of use.   Allergic rhinitis Start environmental control measures as below - for pollen.  Use over the counter antihistamines such as Zyrtec (cetirizine), Claritin (loratadine), Allegra (fexofenadine), or Xyzal (levocetirizine) daily as needed. May take twice a day during allergy flares. May switch antihistamines every few months. Start Ryaltris (olopatadine + mometasone nasal spray combination) 1-2 sprays per nostril twice a day. Sample given. This replaces your other nasal sprays. If this works well for you, then have [facility] ship the medication to your home - prescription already sent in.   Follow up in 3 months or sooner if needed.    Reducing Pollen Exposure Pollen seasons: trees (spring), grass (summer) and ragweed/weeds (fall). Keep windows closed in your home and car to lower pollen exposure.  Install air  conditioning in the bedroom and throughout the house if possible.  Avoid going out in dry windy days - especially early morning. Pollen counts are highest between 5 - 10 AM and on dry, hot and windy days.  Save outside activities for late afternoon or after a heavy rain, when pollen levels are lower.  Avoid mowing of grass if you have grass pollen allergy. Be aware that pollen can also be transported indoors on people and pets.  Dry your clothes in an automatic dryer rather than hanging them outside where they might collect pollen.  Rinse hair and eyes before bedtime.

## 2022-02-27 NOTE — Assessment & Plan Note (Signed)
Hives for 20+ years. Last year she was worked up by [employer] and was started on Xolair 300mg  every 4 weeks with good benefit. Last injection was about 3 months ago and noticing some milder symptoms 1-2 times per week. Patient would like to restart Xolair. Tried OTC antihistamines, Singulair and famotidine with no benefit. Only prednisone helped in the past.  Requesting records from prior allergist. Will start PA restart Xolair 300mg  every 4 week injections. Start zyrtec (cetirizine) 10mg  or allegra 180mg  once a day and may take twice a day during flares.  If symptoms are not controlled or causes drowsiness let us know. Avoid the following potential triggers: alcohol, tight clothing, NSAIDs, hot showers and getting overheated. Get bloodwork - once I reviewed your records. Will mail lab orders to your home.

## 2022-02-27 NOTE — Assessment & Plan Note (Signed)
Some mild rhinoconjunctivitis symptoms and takes over-the-counter antihistamines with good benefit.  Skin testing in the past was positive to pollen per patient report. Requesting records of last allergy test.  Start environmental control measures as below - for pollen.  Use over the counter antihistamines such as Zyrtec (cetirizine), Claritin (loratadine), Allegra (fexofenadine), or Xyzal (levocetirizine) daily as needed. May take twice a day during allergy flares. May switch antihistamines every few months. Start Ryaltris (olopatadine + mometasone nasal spray combination) 1-2 sprays per nostril twice a day. Sample given. This replaces your other nasal sprays. If this works well for you, then have [facility] ship the medication to your home - prescription already sent in.

## 2022-03-05 ENCOUNTER — Telehealth: Payer: Self-pay

## 2022-03-05 ENCOUNTER — Encounter: Payer: Self-pay | Admitting: Allergy

## 2022-03-05 DIAGNOSIS — L508 Other urticaria: Secondary | ICD-10-CM

## 2022-03-05 MED ORDER — AZELASTINE-FLUTICASONE 137-50 MCG/ACT NA SUSP: 1.0000 | Freq: Two times a day (BID) | NASAL | 5 refills | Status: DC

## 2022-03-05 NOTE — Progress Notes (Signed)
Reviewed notes from [facility]. Date of service: multiple. See scanned notes for full documentation. Patient was seen by [facility] for asthma, allergic rhinitis, eoe, hives.  Allergy testing positive to dust mites and trees. On AIT briefly from 2011 to 2012 (T-G-W and Dm-D-M).

## 2022-03-05 NOTE — Telephone Encounter (Signed)
Called patient - DPR verified - LMVOM advising of provider notation below. Patient advised to contact office or send myChart message advising which option she would like to do.

## 2022-03-05 NOTE — Addendum Note (Signed)
Addended by: Garnet Sierras on: 03/05/2022 04:51 PM   Modules accepted: Orders

## 2022-03-05 NOTE — Telephone Encounter (Signed)
Start dymista (fluticasone + azelastine nasal spray combination) 1 spray per nostril twice a day.  Rx sent in.

## 2022-03-05 NOTE — Telephone Encounter (Signed)
Patient's office notes from [place] have been received and given to provider for review.

## 2022-03-05 NOTE — Telephone Encounter (Signed)
Please call patient and let her know that I reviewed notes and ordered labs for her to draw.  Please mail to her home or she can get drawn in our [place] office.

## 2022-03-05 NOTE — Telephone Encounter (Signed)
Per previous allergy office - [facility] [place] - patient has only tried [facility]ort Allergy 24 HR 55 mcg/act Nasal Aerosol 2 sprays each nostril daily.

## 2022-03-05 NOTE — Addendum Note (Signed)
Addended by: Garnet Sierras on: 03/05/2022 04:52 PM   Modules accepted: Orders

## 2022-03-05 NOTE — Telephone Encounter (Signed)
PA request received via CMM for Ryaltris 665-25MCG/ACT suspension  Key: [id number]  PA not submitted due to preferred alternatives of "azelastine-fluticasone, flunisolide, fluticasone, mometasone" not listed in patient chart.   Awaiting advise from provider about previous trial/failure

## 2022-03-15 ENCOUNTER — Telehealth: Payer: Self-pay | Admitting: *Deleted

## 2022-03-15 MED ORDER — AZELASTINE HCL 137 MCG/SPRAY NA SOLN: 1.0000 | Freq: Two times a day (BID) | NASAL | 5 refills | Status: DC

## 2022-03-15 MED ORDER — FLUTICASONE PROPIONATE 50 MCG/ACT NA SUSP: 1.0000 | Freq: Two times a day (BID) | NASAL | 5 refills | Status: DC

## 2022-03-15 NOTE — Telephone Encounter (Signed)
-----  Message from Garnet Sierras, DO sent at 02/27/2022  2:23 PM EST ----- Please start PA for Xolair 300mg  every 4 weeks for CIU. Patient was on it before. Thank you.

## 2022-03-15 NOTE — Telephone Encounter (Signed)
Please call patient and have her schedule a televisit regarding pregnancy and Xolair to discuss further.

## 2022-03-15 NOTE — Telephone Encounter (Signed)
Called patient to advise approval for Xolair and patient advised she found out she is pregnant. She has self administered Xolair in past for herr urticaria usually in thigh area. Patient wants to know if she can start back on therapy self admin she has been off for 3 months. Also wants alternate medication for her nose spray due to cost and ok per Dr Maudie Mercury to split meds to astelin and fluticasone to see if cost saving for patient.

## 2022-03-15 NOTE — Telephone Encounter (Signed)
Called patient - DPR verified - LMOVM regarding provider's notation below.  If patient returns call please advise of provider's notation and schedule a televisit if desired. Thanks.

## 2022-03-23 LAB — ALPHA-GAL PANEL
Allergen Lamb IgE: <0.1 kU/L
Beef IgE: <0.1 kU/L
IgE (Immunoglobulin E), Serum: 114 IU/mL (ref 6–495)
O215-IgE Alpha-Gal: <0.1 kU/L
Pork IgE: <0.1 kU/L

## 2022-03-23 LAB — COMPREHENSIVE METABOLIC PANEL
ALT: 24 IU/L (ref 0–32)
AST: 16 IU/L (ref 0–40)
Albumin/Globulin Ratio: 2 (ref 1.2–2.2)
Albumin: 4.1 g/dL (ref 4.0–5.0)
Alkaline Phosphatase: 84 IU/L (ref 44–121)
BUN/Creatinine Ratio: 18 (ref 9–23)
BUN: 9 mg/dL (ref 6–20)
Bilirubin Total: 0.5 mg/dL (ref 0.0–1.2)
CO2: 19 mmol/L — ABNORMAL LOW (ref 20–29)
Calcium: 9.5 mg/dL (ref 8.7–10.2)
Chloride: 104 mmol/L (ref 96–106)
Creatinine, Ser: 0.49 mg/dL — ABNORMAL LOW (ref 0.57–1.00)
Globulin, Total: 2.1 g/dL (ref 1.5–4.5)
Glucose: 101 mg/dL — ABNORMAL HIGH (ref 70–99)
Potassium: 3.7 mmol/L (ref 3.5–5.2)
Sodium: 136 mmol/L (ref 134–144)
Total Protein: 6.2 g/dL (ref 6.0–8.5)
eGFR: 134 mL/min/{1.73_m2} (ref >59)

## 2022-03-23 LAB — THYROID CASCADE PROFILE: TSH: 3.12 u[IU]/mL (ref 0.450–4.500)

## 2022-03-23 LAB — CBC WITH DIFFERENTIAL/PLATELET
Basophils Absolute: 0 10*3/uL (ref 0.0–0.2)
Basos: 0 %
EOS (ABSOLUTE): 0.2 10*3/uL (ref 0.0–0.4)
Eos: 2 %
Hematocrit: 36.7 % (ref 34.0–46.6)
Hemoglobin: 12.6 g/dL (ref 11.1–15.9)
Immature Grans (Abs): 0.1 10*3/uL (ref 0.0–0.1)
Immature Granulocytes: 1 %
Lymphocytes Absolute: 2.8 10*3/uL (ref 0.7–3.1)
Lymphs: 31 %
MCH: 30.4 pg (ref 26.6–33.0)
MCHC: 34.3 g/dL (ref 31.5–35.7)
MCV: 89 fL (ref 79–97)
Monocytes Absolute: 0.5 10*3/uL (ref 0.1–0.9)
Monocytes: 5 %
Neutrophils Absolute: 5.6 10*3/uL (ref 1.4–7.0)
Neutrophils: 61 %
Platelets: 209 10*3/uL (ref 150–450)
RBC: 4.14 x10E6/uL (ref 3.77–5.28)
RDW: 11.7 % (ref 11.7–15.4)
WBC: 9.1 10*3/uL (ref 3.4–10.8)

## 2022-03-23 LAB — C-REACTIVE PROTEIN: CRP: 5 mg/L (ref 0–10)

## 2022-03-23 LAB — TRYPTASE: Tryptase: 3.8 ug/L (ref 2.2–13.2)

## 2022-03-23 LAB — CHRONIC URTICARIA: cu index: <1 (ref <10)

## 2022-03-23 LAB — SEDIMENTATION RATE: Sed Rate: 10 mm/hr (ref 0–32)

## 2022-03-23 LAB — C3 AND C4
Complement C3, Serum: 126 mg/dL (ref 82–167)
Complement C4, Serum: 21 mg/dL (ref 12–38)

## 2022-03-23 LAB — ANA W/REFLEX: Anti Nuclear Antibody (ANA): NEGATIVE

## 2022-05-28 NOTE — Progress Notes (Deleted)
Follow Up Note  RE: Jessica Andersen MRN: [id number] DOB: 07/20/1995 Date of Office Visit: 05/29/2022  Referring provider: No ref. provider found Primary care provider: Patient, No Pcp Per  Chief Complaint: No chief complaint on file.  History of Present Illness: I had the pleasure of seeing Jessica Andersen for a follow up visit at the [facility] on 05/28/2022. She is a 27 y.o. female, who is being followed for chronic urticaria, allergic rhinitis and asthma. Her previous allergy office visit was on 02/27/2022 with Dr. Selena Batten. Today is a regular follow up visit.   I reviewed the bloodwork. Blood count, kidney function, liver function, electrolytes, thyroid, autoimmune screener, inflammation markers, chronic urticaria index (checks for autoantibodies that trigger mast cells), tryptase (checks for mast cell issues) and alpha gal (checks for red meat allergy) were all normal which is great.   If you are still interested in starting Xolair - please make a televisit prior to starting to discuss the risks and benefits as my nurse also told me that you are pregnant now.  Chronic urticaria Hives for 20+ years. Last year she was worked up by [employer] and was started on Xolair 300mg  every 4 weeks with good benefit. Last injection was about 3 months ago and noticing some milder symptoms 1-2 times per week. Patient would like to restart Xolair. Tried OTC antihistamines, Singulair and famotidine with no benefit. Only prednisone helped in the past.  Requesting records from prior allergist. Will start PA restart Xolair 300mg  every 4 week injections. Start zyrtec (cetirizine) 10mg  or allegra 180mg  once a day and may take twice a day during flares.  If symptoms are not controlled or causes drowsiness let us know. Avoid the following potential triggers: alcohol, tight clothing, NSAIDs, hot showers and getting overheated. Get bloodwork - once I reviewed your records. Will mail lab orders to  your home.    Other allergic rhinitis Some mild rhinoconjunctivitis symptoms and takes over-the-counter antihistamines with good benefit.  Skin testing in the past was positive to pollen per patient report. Requesting records of last allergy test.  Start environmental control measures as below - for pollen.  Use over the counter antihistamines such as Zyrtec (cetirizine), Claritin (loratadine), Allegra (fexofenadine), or Xyzal (levocetirizine) daily as needed. May take twice a day during allergy flares. May switch antihistamines every few months. Start Ryaltris (olopatadine + mometasone nasal spray combination) 1-2 sprays per nostril twice a day. Sample given. This replaces your other nasal sprays. If this works well for you, then have [facility] ship the medication to your home - prescription already sent in.    Mild intermittent asthma without complication Diagnosed with asthma as a young child. Main triggers: dust and exertion. Uses albuterol less than once per month. Today's spirometry was normal. May use albuterol rescue inhaler 2 puffs every 4 to 6 hours as needed for shortness of breath, chest tightness, coughing, and wheezing. May use albuterol rescue inhaler 2 puffs 5 to 15 minutes prior to strenuous physical activities. Monitor frequency of use.    Return in about 3 months (around 05/29/2022).  Assessment and Plan: Jessica Andersen is a 27 y.o. female with: No problem-specific Assessment & Plan notes found for this encounter.  No follow-ups on file.  No orders of the defined types were placed in this encounter.  Lab Orders  No laboratory test(s) ordered today    Diagnostics: Spirometry:  Tracings reviewed. Her effort: {Blank single:19197::"Good reproducible efforts.","It was hard to get consistent  efforts and there is a question as to whether this reflects a maximal maneuver.","Poor effort, data can not be interpreted."} FVC: ***L FEV1: ***L, ***% predicted FEV1/FVC ratio:  ***% Interpretation: {Blank single:19197::"Spirometry consistent with mild obstructive disease","Spirometry consistent with moderate obstructive disease","Spirometry consistent with severe obstructive disease","Spirometry consistent with possible restrictive disease","Spirometry consistent with mixed obstructive and restrictive disease","Spirometry uninterpretable due to technique","Spirometry consistent with normal pattern","No overt abnormalities noted given today's efforts"}.  Please see scanned spirometry results for details.  Skin Testing: {Blank single:19197::"Select foods","Environmental allergy panel","Environmental allergy panel and select foods","Food allergy panel","None","Deferred due to recent antihistamines use"}. *** Results discussed with patient/family.   Medication List:  Current Outpatient Medications  Medication Sig Dispense Refill   acetaminophen (TYLENOL) 500 MG tablet Take by mouth.     albuterol (VENTOLIN HFA) 108 (90 Base) MCG/ACT inhaler Inhale 2 puffs into the lungs every 4 (four) hours as needed for wheezing or shortness of breath (coughing fits). 18 g 1   Azelastine HCl 137 MCG/SPRAY SOLN Place 1 spray into the nose 2 (two) times daily. 30 mL 5   Azelastine-Fluticasone 137-50 MCG/ACT SUSP Place 1 spray into the nose in the morning and at bedtime. 23 g 5   baclofen (LIORESAL) 10 MG tablet Take by mouth.     BOTOX 200 units injection Provider to inject two hundred (200) units into the muscle every 3 months per PREEMPT protocol for chronic migraine.     buPROPion (WELLBUTRIN XL) 150 MG 24 hr tablet Take 1 tablet (150 mg total) by mouth daily. 30 tablet 2   clindamycin (CLINDAGEL) 1 % gel Apply topically.     clonazePAM (KLONOPIN) 0.5 MG disintegrating tablet Take 1 tablet (0.5 mg total) by mouth 2 (two) times daily as needed (anxiety). 60 tablet 2   DULoxetine (CYMBALTA) 60 MG capsule Take 2 capsules (120 mg total) by mouth at bedtime. 60 capsule 2   fluticasone  (FLONASE) 50 MCG/ACT nasal spray Place 1 spray into both nostrils 2 (two) times daily. 16 g 5   fluticasone (FLOVENT HFA) 220 MCG/ACT inhaler Inhale into the lungs.     gabapentin (NEURONTIN) 100 MG capsule Take by mouth.     hydrocortisone 2.5 % cream Apply a small amount to affected area twice a day as needed     lisinopril (ZESTRIL) 5 MG tablet Take 5 mg by mouth daily.     metoCLOPramide (REGLAN) 10 MG tablet Take by mouth.     omeprazole (PRILOSEC) 40 MG capsule Take by mouth.     orphenadrine (NORFLEX) 100 MG tablet Take by mouth.     OXcarbazepine (TRILEPTAL PO) Take by mouth.     Oxcarbazepine (TRILEPTAL) 300 MG tablet Take 1 tablet (300 mg total) by mouth 2 (two) times daily. 60 tablet 2   prazosin (MINIPRESS) 1 MG capsule Take 1 capsule (1 mg total) by mouth at bedtime. 30 capsule 2   terbinafine (LAMISIL) 250 MG tablet Take 250 mg by mouth daily.     traZODone (DESYREL) 50 MG tablet Take 50 mg by mouth at bedtime.     UBRELVY 100 MG TABS Take 1 tablet by mouth 2 (two) times daily as needed.     Vitamin D, Ergocalciferol, 50000 units CAPS Take 1 capsule by mouth 2 (two) times a week.     zolpidem (AMBIEN) 10 MG tablet Take 1 tablet (10 mg total) by mouth at bedtime as needed for sleep. 30 tablet 2   No current facility-administered medications for this visit.  Allergies: Allergies  Allergen Reactions   Other Hives    Environmental (Trees, grasses, pollen).   Baby Wipes Other (See Comments)    Burning sensation   Dexamethasone Other (See Comments)    "extreme burning sensation"   Icy Hot Hives   Penicillins Hives   Wound Dressings Hives    Super duty band-aid adhesive, not allergic to regular adhesive   I reviewed her past medical history, social history, family history, and environmental history and no significant changes have been reported from her previous visit.  Review of Systems  Constitutional:  Negative for appetite change, chills, fever and unexpected weight  change.  HENT:  Positive for congestion. Negative for rhinorrhea.   Eyes:  Negative for itching.  Respiratory:  Negative for cough, chest tightness, shortness of breath and wheezing.   Cardiovascular:  Negative for chest pain.  Gastrointestinal:  Negative for abdominal pain.  Genitourinary:  Negative for difficulty urinating.  Skin:  Positive for rash.  Allergic/Immunologic: Positive for environmental allergies. Negative for food allergies.  Neurological:  Positive for headaches.    Objective: There were no vitals taken for this visit. There is no height or weight on file to calculate BMI. Physical Exam Vitals and nursing note reviewed.  Constitutional:      Appearance: Normal appearance. She is well-developed.  HENT:     Head: Normocephalic and atraumatic.     Right Ear: Tympanic membrane and external ear normal.     Left Ear: Tympanic membrane and external ear normal.     Nose: Congestion present.     Mouth/Throat:     Mouth: Mucous membranes are moist.     Pharynx: Oropharynx is clear.  Eyes:     Conjunctiva/sclera: Conjunctivae normal.  Cardiovascular:     Rate and Rhythm: Normal rate and regular rhythm.     Heart sounds: Normal heart sounds. No murmur heard.    No friction rub. No gallop.  Pulmonary:     Effort: Pulmonary effort is normal.     Breath sounds: Normal breath sounds. No wheezing, rhonchi or rales.  Musculoskeletal:     Cervical back: Neck supple.  Skin:    General: Skin is warm.     Findings: No rash.  Neurological:     Mental Status: She is alert and oriented to person, place, and time.  Psychiatric:        Behavior: Behavior normal.    Previous notes and tests were reviewed. The plan was reviewed with the patient/family, and all questions/concerned were addressed.  It was my pleasure to see Jessica Andersen today and participate in her care. Please feel free to contact me with any questions or concerns.  Sincerely,  Wyline MoodYoon Milaya Hora, DO Allergy &  Immunology  [facility] [place] [place] office: [phone] [facility] office: [phone]

## 2022-05-29 ENCOUNTER — Ambulatory Visit: Payer: BC Managed Care – PPO | Admitting: Allergy

## 2022-05-29 DIAGNOSIS — L508 Other urticaria: Secondary | ICD-10-CM

## 2022-05-29 DIAGNOSIS — J4520 Mild intermittent asthma, uncomplicated: Secondary | ICD-10-CM

## 2022-05-29 DIAGNOSIS — J3089 Other allergic rhinitis: Secondary | ICD-10-CM

## 2022-08-28 ENCOUNTER — Encounter: Payer: Self-pay | Admitting: Allergy

## 2022-08-28 ENCOUNTER — Other Ambulatory Visit: Payer: Self-pay

## 2022-08-28 ENCOUNTER — Ambulatory Visit (INDEPENDENT_AMBULATORY_CARE_PROVIDER_SITE_OTHER): Payer: BC Managed Care – PPO | Admitting: Allergy

## 2022-08-28 VITALS — BP 114/62 | HR 95 | Temp 98.2°F | Resp 16 | Ht 62.75 in | Wt 200.2 lb

## 2022-08-28 DIAGNOSIS — J3089 Other allergic rhinitis: Secondary | ICD-10-CM

## 2022-08-28 DIAGNOSIS — L508 Other urticaria: Secondary | ICD-10-CM | POA: Diagnosis not present

## 2022-08-28 DIAGNOSIS — J4520 Mild intermittent asthma, uncomplicated: Secondary | ICD-10-CM

## 2022-08-28 NOTE — Progress Notes (Signed)
Follow Up Note  RE: Jessica Andersen MRN: [id number] DOB: 1996-01-14 Date of Office Visit: 08/28/2022  Referring provider: No ref. provider found Primary care provider: Patient, No Pcp Per  Chief Complaint: Urticaria (Had hives but are gone now. With xolair she is itchy and feels heat under her skin. Wants to get back on Xolair. )  History of Present Illness: I had the pleasure of seeing Jessica Andersen for a follow up visit at the [facility] on 08/28/2022. She is a 27 y.o. female, who is being followed for CIU, allergic rhinitis and asthma. Her previous allergy office visit was on 02/27/2022 with Dr. Selena Batten. Today is a regular follow up visit.  Chronic urticaria Patient had miscarriage 2 months ago.   Patient took Xolair injections for 2 times after the last visit and stopped once she found out she was pregnant as her hives didn't flare with the first pregnancy.   She started to break out again last week. She took benadryl and hydroxyzine (prescribed for anxiety) with some benefit.  She would like to go back on Xolair as it worked the best. Antihistamines were ineffective in the past. No recent prednisone use.   Allergic rhinitis Asymptomatic with no daily meds.  Mild intermittent asthma Denies any SOB, coughing, wheezing, chest tightness, nocturnal awakenings, ER/urgent care visits or prednisone use since the last visit.  Assessment and Plan: Jessica Andersen is a 27 y.o. female with: Chronic urticaria Past history - hives for 20+ years. Last year she was worked up by [employer] and was started on Xolair 300mg  every 4 weeks with good benefit. Last injection was about 3 months ago and noticing some milder symptoms 1-2 times per week. Patient would like to restart Xolair. Tried OTC antihistamines, Singulair and famotidine with no benefit. Only prednisone helped in the past. 2024 Bloodwork (CBC diff, CMP, TSH, ANA, ESR, CRP, CU, tryptase, Alpha gal) all normal.  Interim history  - only took Xolair for 2 months as she was pregnant but then had a miscarriage. Breaking out again.  Start zyrtec (cetirizine) 10mg  or allegra 180mg  once a day and may take twice a day during flares.  If symptoms are not controlled or causes drowsiness let us know. Start taking famotine 20mg  twice a day. May take hydroxyzine as needed for itching/hives/anxiety. Will restart on Xolair 300mg  every 4 weeks. Briefly discussed that if she gets pregnant again then okay to continue Xolair. The EXPECT study did not show increase in major birth defects or miscarriage in pregnant patients who were also on Xolair.  Avoid the following potential triggers: alcohol, tight clothing, NSAIDs, hot showers and getting overheated.  Mild intermittent asthma without complication Past history - diagnosed with asthma as a young child. Main triggers: dust and exertion. Uses albuterol less than once per month. 2024 spirometry was normal. May use albuterol rescue inhaler 2 puffs every 4 to 6 hours as needed for shortness of breath, chest tightness, coughing, and wheezing. May use albuterol rescue inhaler 2 puffs 5 to 15 minutes prior to strenuous physical activities. Monitor frequency of use.   Other allergic rhinitis Past history - some mild rhinoconjunctivitis symptoms and takes over-the-counter antihistamines with good benefit.  Allergy testing positive to dust mites and trees. On AIT briefly from 2011 to 2012 (T-G-W and Dm-D-M). Interim history - asymptomatic.  Continue environmental control measures. Use over the counter antihistamines such as Zyrtec (cetirizine), Claritin (loratadine), Allegra (fexofenadine), or Xyzal (levocetirizine) daily as needed. May take  twice a day during allergy flares. May switch antihistamines every few months.  Return in about 4 months (around 12/29/2022).  No orders of the defined types were placed in this encounter.  Lab Orders  No laboratory test(s) ordered today     Diagnostics: None.    Medication List:  Current Outpatient Medications  Medication Sig Dispense Refill   albuterol (VENTOLIN HFA) 108 (90 Base) MCG/ACT inhaler Inhale 2 puffs into the lungs every 4 (four) hours as needed for wheezing or shortness of breath (coughing fits). 18 g 1   Blood Glucose Monitoring Suppl (ACCU-CHEK GUIDE) w/Device KIT Use as directed by physician to monitor blood sugars     calcium acetate (PHOSLO) 667 MG capsule Take by mouth.     FLUoxetine (PROZAC) 40 MG capsule Take by mouth.     fluticasone (FLOVENT HFA) 220 MCG/ACT inhaler Inhale into the lungs.     glucose blood test strip Use to accu-chek guide test strips to monitor blood glucose 4 times daily     hydrocortisone 2.5 % cream Apply a small amount to affected area twice a day as needed     hydrOXYzine (VISTARIL) 25 MG capsule Take by mouth.     labetalol (NORMODYNE) 100 MG tablet Take by mouth.     lisinopril (ZESTRIL) 5 MG tablet Take 5 mg by mouth daily.     Magnesium 250 MG TABS Take 1 tablet by mouth daily.     metroNIDAZOLE (FLAGYL) 500 MG tablet Take 500 mg by mouth 2 (two) times daily.     orphenadrine (NORFLEX) 100 MG tablet Take by mouth.     phentermine (ADIPEX-P) 37.5 MG tablet Break the tablet in half. Take one half tablet 30 minutes before breakfast and the remaining half tablet 30 minutes before lunch.     topiramate (TOPAMAX) 25 MG tablet Take by mouth.     traZODone (DESYREL) 50 MG tablet Take 50 mg by mouth at bedtime.     Vitamin D, Ergocalciferol, 50000 units CAPS Take 1 capsule by mouth 2 (two) times a week.     clonazePAM (KLONOPIN) 0.5 MG disintegrating tablet Take 1 tablet (0.5 mg total) by mouth 2 (two) times daily as needed (anxiety). 60 tablet 2   fluticasone (FLONASE) 50 MCG/ACT nasal spray Place 1 spray into both nostrils 2 (two) times daily. (Patient not taking: Reported on 08/28/2022) 16 g 5   metoCLOPramide (REGLAN) 10 MG tablet Take by mouth. (Patient not taking: Reported on  08/28/2022)     Oxcarbazepine (TRILEPTAL) 300 MG tablet Take 1 tablet (300 mg total) by mouth 2 (two) times daily. 60 tablet 2   prazosin (MINIPRESS) 1 MG capsule Take 1 capsule (1 mg total) by mouth at bedtime. 30 capsule 2   No current facility-administered medications for this visit.   Allergies: Allergies  Allergen Reactions   Other Hives    Environmental (Trees, grasses, pollen).   Baby Wipes Other (See Comments)    Burning sensation   Dexamethasone Other (See Comments)    "extreme burning sensation"   Icy Hot Hives   Penicillins Hives   Wound Dressings Hives    Super duty band-aid adhesive, not allergic to regular adhesive   I reviewed her past medical history, social history, family history, and environmental history and no significant changes have been reported from her previous visit.  Review of Systems  Constitutional:  Negative for appetite change, chills, fever and unexpected weight change.  HENT:  Negative for congestion and rhinorrhea.  Eyes:  Negative for itching.  Respiratory:  Negative for cough, chest tightness, shortness of breath and wheezing.   Cardiovascular:  Negative for chest pain.  Gastrointestinal:  Negative for abdominal pain.  Genitourinary:  Negative for difficulty urinating.  Skin:  Positive for rash.  Allergic/Immunologic: Positive for environmental allergies. Negative for food allergies.    Objective: BP 114/62   Pulse 95   Temp 98.2 F (36.8 C)   Resp 16   Ht 5' 2.75" (1.594 m)   Wt 200 lb 4 oz (90.8 kg)   SpO2 97%   BMI 35.76 kg/m  Body mass index is 35.76 kg/m. Physical Exam Vitals and nursing note reviewed.  Constitutional:      Appearance: Normal appearance. She is well-developed.  HENT:     Head: Normocephalic and atraumatic.     Right Ear: Tympanic membrane and external ear normal.     Left Ear: Tympanic membrane and external ear normal.     Nose: Nose normal.     Mouth/Throat:     Mouth: Mucous membranes are moist.      Pharynx: Oropharynx is clear.  Eyes:     Conjunctiva/sclera: Conjunctivae normal.  Cardiovascular:     Rate and Rhythm: Normal rate and regular rhythm.     Heart sounds: Normal heart sounds. No murmur heard.    No friction rub. No gallop.  Pulmonary:     Effort: Pulmonary effort is normal.     Breath sounds: Normal breath sounds. No wheezing, rhonchi or rales.  Musculoskeletal:     Cervical back: Neck supple.  Skin:    General: Skin is warm.     Findings: No rash.  Neurological:     Mental Status: She is alert and oriented to person, place, and time.  Psychiatric:        Behavior: Behavior normal.    Previous notes and tests were reviewed. The plan was reviewed with the patient/family, and all questions/concerned were addressed.  It was my pleasure to see Jessica Andersen today and participate in her care. Please feel free to contact me with any questions or concerns.  Sincerely,  Wyline Mood, DO Allergy & Immunology  [facility] office: [phone] [facility] office: [phone]

## 2022-08-28 NOTE — Assessment & Plan Note (Signed)
Past history - some mild rhinoconjunctivitis symptoms and takes over-the-counter antihistamines with good benefit.  Allergy testing positive to dust mites and trees. On AIT briefly from 2011 to 2012 (T-G-W and Dm-D-M). Interim history - asymptomatic.  Continue environmental control measures. Use over the counter antihistamines such as Zyrtec (cetirizine), Claritin (loratadine), Allegra (fexofenadine), or Xyzal (levocetirizine) daily as needed. May take twice a day during allergy flares. May switch antihistamines every few months.

## 2022-08-28 NOTE — Assessment & Plan Note (Addendum)
Past history - hives for 20+ years. Last year she was worked up by [employer] and was started on Xolair 300mg  every 4 weeks with good benefit. Last injection was about 3 months ago and noticing some milder symptoms 1-2 times per week. Patient would like to restart Xolair. Tried OTC antihistamines, Singulair and famotidine with no benefit. Only prednisone helped in the past. 2024 Bloodwork (CBC diff, CMP, TSH, ANA, ESR, CRP, CU, tryptase, Alpha gal) all normal.  Interim history - only took Xolair for 2 months as she was pregnant but then had a miscarriage. Breaking out again.  Start zyrtec (cetirizine) 10mg  or allegra 180mg  once a day and may take twice a day during flares.  If symptoms are not controlled or causes drowsiness let us know. Start taking famotine 20mg  twice a day. May take hydroxyzine as needed for itching/hives/anxiety. Will restart on Xolair 300mg  every 4 weeks. Briefly discussed that if she gets pregnant again then okay to continue Xolair. The EXPECT study did not show increase in major birth defects or miscarriage in pregnant patients who were also on Xolair.  Avoid the following potential triggers: alcohol, tight clothing, NSAIDs, hot showers and getting overheated.

## 2022-08-28 NOTE — Patient Instructions (Addendum)
Hives: Start zyrtec (cetirizine) 10mg  or allegra 180mg  once a day and may take twice a day during flares.  If symptoms are not controlled or causes drowsiness let us know. Start taking famotine 20mg  twice a day. May take hydroxyzine as needed for itching/hives/anxiety. Will restart on Xolair 300mg  every 4 weeks. Tammy will be in touch with you.  Avoid the following potential triggers: alcohol, tight clothing, NSAIDs, hot showers and getting overheated.  Asthma May use albuterol rescue inhaler 2 puffs every 4 to 6 hours as needed for shortness of breath, chest tightness, coughing, and wheezing. May use albuterol rescue inhaler 2 puffs 5 to 15 minutes prior to strenuous physical activities. Monitor frequency of use.   Follow up in 4 months or sooner if needed.

## 2022-08-28 NOTE — Assessment & Plan Note (Signed)
Past history - diagnosed with asthma as a young child. Main triggers: dust and exertion. Uses albuterol less than once per month. 2024 spirometry was normal. May use albuterol rescue inhaler 2 puffs every 4 to 6 hours as needed for shortness of breath, chest tightness, coughing, and wheezing. May use albuterol rescue inhaler 2 puffs 5 to 15 minutes prior to strenuous physical activities. Monitor frequency of use.

## 2022-08-29 ENCOUNTER — Telehealth: Payer: Self-pay | Admitting: *Deleted

## 2022-08-29 NOTE — Telephone Encounter (Signed)
L/m for patient to contact me regarding Xolair  

## 2022-08-29 NOTE — Telephone Encounter (Signed)
-----   Message from Ellamae Sia, DO sent at 08/28/2022  5:13 PM EDT ----- Please start PA for Xolair 300mg  every 4 weeks for hives - patient stopped and wants to restart.

## 2022-09-19 ENCOUNTER — Encounter: Payer: Self-pay | Admitting: Allergy

## 2022-09-26 NOTE — Telephone Encounter (Signed)
Spoke to patient and she does want to restart. Will send rx to [facility] and reach out to her once delivery set to make appt to restart

## 2022-12-27 ENCOUNTER — Encounter: Payer: Self-pay | Admitting: Allergy

## 2022-12-27 ENCOUNTER — Ambulatory Visit: Payer: BC Managed Care – PPO | Admitting: Allergy

## 2022-12-27 VITALS — BP 98/60 | HR 99 | Temp 98.7°F | Resp 18

## 2022-12-27 DIAGNOSIS — L508 Other urticaria: Secondary | ICD-10-CM | POA: Diagnosis not present

## 2022-12-27 DIAGNOSIS — J301 Allergic rhinitis due to pollen: Secondary | ICD-10-CM | POA: Diagnosis not present

## 2022-12-27 DIAGNOSIS — J3089 Other allergic rhinitis: Secondary | ICD-10-CM

## 2022-12-27 DIAGNOSIS — T781XXD Other adverse food reactions, not elsewhere classified, subsequent encounter: Secondary | ICD-10-CM

## 2022-12-27 DIAGNOSIS — J4520 Mild intermittent asthma, uncomplicated: Secondary | ICD-10-CM | POA: Diagnosis not present

## 2022-12-27 DIAGNOSIS — J3081 Allergic rhinitis due to animal (cat) (dog) hair and dander: Secondary | ICD-10-CM

## 2022-12-27 DIAGNOSIS — R220 Localized swelling, mass and lump, head: Secondary | ICD-10-CM

## 2022-12-27 NOTE — Patient Instructions (Addendum)
Hives: Restart Xolair 300mg  every 4 weeks - at home. Avoid the following potential triggers: alcohol, tight clothing, NSAIDs, hot showers and getting overheated.  During flares:  Start zyrtec (cetirizine) 10mg  or allegra 180mg  once a day and may take twice a day during flares.  If symptoms are not controlled or causes drowsiness let us know. Start taking famotine 20mg  twice a day. May take hydroxyzine as needed for itching/hives/anxiety.  Food Avoid Hershey's chocolate. Unable to skin test as you are on Xolair injections.  Asthma May use albuterol rescue inhaler 2 puffs every 4 to 6 hours as needed for shortness of breath, chest tightness, coughing, and wheezing. May use albuterol rescue inhaler 2 puffs 5 to 15 minutes prior to strenuous physical activities. Monitor frequency of use - if you need to use it more than twice per week on a consistent basis let us know.   Lip swelling Monitor symptoms. Consider stopping lisinopril if lip swelling recurs as this medication can cause lip swelling.  Environmental allergies Continue environmental control measures. May use over the counter antihistamines such as Zyrtec (cetirizine), Claritin (loratadine), Allegra (fexofenadine), or Xyzal (levocetirizine) daily as needed.   Follow up in 4 months or sooner if needed.

## 2022-12-27 NOTE — Progress Notes (Signed)
Follow Up Note  RE: Jessica Andersen MRN: [id number] DOB: [birth year] Date of Office Visit: 12/27/2022  Referring provider: No ref. provider found Primary care provider: Loleta Dicker, FNP  Chief Complaint: Urticaria  History of Present Illness: I had the pleasure of seeing Jessica Andersen for a follow up visit at the [facility] on 12/27/2022. She is a 27 y.o. female, who is being followed for chronic urticaria, asthma, allergic rhinitis. Her previous allergy office visit was on 08/28/2022 with Dr. Selena Batten. Today is a regular follow up visit.  Discussed the use of AI scribe software for clinical note transcription with the patient, who gave verbal consent to proceed.  The patient, recently recovering from a sinus infection, was treated with azithromycin and steroids. She also reports a history of fluctuating hives, which have been managed with monthly Xolair injections. However, she missed her most recent injection due to personal circumstances. She describes experiencing not hives, but "splotches of itchiness" and small red dots that become intensely itchy.  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 This reaction does not occur with other brands or types of chocolate. She also experienced a recent episode of lip swelling, which she attributes to either trauma or a reaction to a chocolate protein shake.  The patient has a history of asthma, but reports only needing her inhaler when exposed to Clorox bleach. She also lives in a house with mold, but takes precautions to limit exposure.  She reports dryness of the eyes, nose, mouth, and lips, which is being investigated by her primary care provider. She is also on lisinopril for hypertension, which has been managed for approximately two years.  The patient has been trying to reduce her overall medication load and has stopped taking over-the-counter antihistamines.  She also reports a history of mouth ulcers, which she believes were caused by a specific toothpaste. Since switching brands, the ulcers have resolved.     Assessment and Plan: Jessica Andersen is a 27 y.o. female with: Chronic urticaria Past history - hives for 20+ years. Last year she was worked up by [employer] and was started on Xolair 300mg  every 4 weeks with good benefit. Last injection was about 3 months ago and noticing some milder symptoms 1-2 times per week. Patient would like to restart Xolair. Tried OTC antihistamines, Singulair and famotidine with no benefit. Only prednisone helped in the past. 2024 Bloodwork (CBC diff, CMP, TSH, ANA, ESR, CRP, CU, tryptase, Alpha gal) all normal.  Interim history - Missed Xolair injection, resulting in increased itchiness and possible lip swelling. Xolair generally effective in reducing symptoms. Does not like to take antihistamines as they dry her out too much. Restart Xolair 300mg  every 4 weeks - at home. Avoid the following potential triggers: alcohol, tight clothing, NSAIDs, hot showers and getting overheated. During flares:  Start zyrtec (cetirizine) 10mg  or allegra 180mg  once a day and may take twice a day during flares.  If symptoms are not controlled or causes drowsiness let us know. Start taking famotine 20mg  twice a day. May take hydroxyzine as needed for itching/hives/anxiety.  Mild intermittent asthma without complication Past history - diagnosed with asthma as a young child. Main triggers: dust and exertion. Uses albuterol less than once per month. 2024 spirometry was normal. Interim history - well controlled.  May use albuterol rescue inhaler 2 puffs every 4 to 6 hours as needed for shortness of breath, chest tightness,  coughing, and wheezing. May use albuterol rescue inhaler 2 puffs 5 to 15 minutes prior to strenuous physical activities. Monitor frequency of use - if you need to use it more than twice per week on a consistent basis let us  know.   Other adverse food reactions, not elsewhere classified, subsequent encounter Reports tingling sensation in mouth after consuming Hershey's chocolate, but not other chocolate brands. Avoid Hershey's chocolate.  Seasonal allergic rhinitis due to pollen Allergic rhinitis due to dust mite Allergic rhinitis due to animal dander Allergic rhinitis due to mold Past history - some mild rhinoconjunctivitis symptoms and takes over-the-counter antihistamines with good benefit.  Allergy testing positive to dust mites and trees. On AIT briefly from 2011 to 2012 (T-G-W and Dm-D-M). Interim history - minimal symptoms.  Continue environmental control measures. May use over the counter antihistamines such as Zyrtec (cetirizine), Claritin (loratadine), Allegra (fexofenadine), or Xyzal (levocetirizine) daily as needed.   Lip swelling Not sure if it's from trauma, chocolate protein shake, missed Xolair dosing or ace-inhibitor induced.  Monitor symptoms. Consider stopping lisinopril if lip swelling recurs as this medication can cause lip swelling.  Return in about 4 months (around 04/26/2023).  No orders of the defined types were placed in this encounter.  Lab Orders  No laboratory test(s) ordered today    Diagnostics: None.   Medication List:  Current Outpatient Medications  Medication Sig Dispense Refill   albuterol (VENTOLIN HFA) 108 (90 Base) MCG/ACT inhaler Inhale 2 puffs into the lungs every 4 (four) hours as needed for wheezing or shortness of breath (coughing fits). 18 g 1   Blood Glucose Monitoring Suppl (ACCU-CHEK GUIDE) w/Device KIT Use as directed by physician to monitor blood sugars     calcium acetate (PHOSLO) 667 MG capsule Take by mouth.     FLUoxetine (PROZAC) 40 MG capsule Take by mouth.     fluticasone (FLONASE) 50 MCG/ACT nasal spray Place 1 spray into both nostrils 2 (two) times daily. 16 g 5   fluticasone (FLOVENT HFA) 220 MCG/ACT inhaler Inhale into the lungs.      glucose blood test strip Use to accu-chek guide test strips to monitor blood glucose 4 times daily     hydrocortisone 2.5 % cream Apply a small amount to affected area twice a day as needed     hydrOXYzine (VISTARIL) 25 MG capsule Take by mouth.     labetalol (NORMODYNE) 100 MG tablet Take by mouth.     lisinopril (ZESTRIL) 5 MG tablet Take 5 mg by mouth daily.     Magnesium 250 MG TABS Take 1 tablet by mouth daily.     metoCLOPramide (REGLAN) 10 MG tablet Take by mouth.     metroNIDAZOLE (FLAGYL) 500 MG tablet Take 500 mg by mouth 2 (two) times daily.     orphenadrine (NORFLEX) 100 MG tablet Take by mouth.     phentermine (ADIPEX-P) 37.5 MG tablet Break the tablet in half. Take one half tablet 30 minutes before breakfast and the remaining half tablet 30 minutes before lunch.     topiramate (TOPAMAX) 25 MG tablet Take by mouth.     traZODone (DESYREL) 50 MG tablet Take 50 mg by mouth at bedtime.     Vitamin D, Ergocalciferol, 50000 units CAPS Take 1 capsule by mouth 2 (two) times a week.     clonazePAM (KLONOPIN) 0.5 MG disintegrating tablet Take 1 tablet (0.5 mg total) by mouth 2 (two) times daily as needed (anxiety). 60 tablet 2   Oxcarbazepine (  TRILEPTAL) 300 MG tablet Take 1 tablet (300 mg total) by mouth 2 (two) times daily. 60 tablet 2   prazosin (MINIPRESS) 1 MG capsule Take 1 capsule (1 mg total) by mouth at bedtime. 30 capsule 2   No current facility-administered medications for this visit.   Allergies: Allergies  Allergen Reactions   Other Hives    Environmental (Trees, grasses, pollen).   Baby Wipes Other (See Comments)    Burning sensation   Dexamethasone Other (See Comments)    "extreme burning sensation"   Icy Hot Hives   Penicillins Hives   Wound Dressings Hives    Super duty band-aid adhesive, not allergic to regular adhesive   I reviewed her past medical history, social history, family history, and environmental history and no significant changes have been reported  from her previous visit.  Review of Systems  Constitutional:  Negative for appetite change, chills, fever and unexpected weight change.  HENT:  Negative for congestion and rhinorrhea.   Eyes:  Negative for itching.  Respiratory:  Negative for cough, chest tightness, shortness of breath and wheezing.   Cardiovascular:  Negative for chest pain.  Gastrointestinal:  Negative for abdominal pain.  Genitourinary:  Negative for difficulty urinating.  Skin:  Positive for rash.  Allergic/Immunologic: Positive for environmental allergies. Negative for food allergies.    Objective: BP 98/60   Pulse 99   Temp 98.7 F (37.1 C) (Temporal)   Resp 18   SpO2 97%  There is no height or weight on file to calculate BMI. Physical Exam Vitals and nursing note reviewed.  Constitutional:      Appearance: Normal appearance. She is well-developed.  HENT:     Head: Normocephalic and atraumatic.     Right Ear: Tympanic membrane and external ear normal.     Left Ear: Tympanic membrane and external ear normal.     Nose: Nose normal.     Mouth/Throat:     Mouth: Mucous membranes are moist.     Pharynx: Oropharynx is clear.     Comments: Slight lower lip swelling on left side. Eyes:     Conjunctiva/sclera: Conjunctivae normal.  Cardiovascular:     Rate and Rhythm: Normal rate and regular rhythm.     Heart sounds: Normal heart sounds. No murmur heard.    No friction rub. No gallop.  Pulmonary:     Effort: Pulmonary effort is normal.     Breath sounds: Normal breath sounds. No wheezing, rhonchi or rales.  Musculoskeletal:     Cervical back: Neck supple.  Skin:    General: Skin is warm.     Findings: No rash.  Neurological:     Mental Status: She is alert and oriented to person, place, and time.  Psychiatric:        Behavior: Behavior normal.    Previous notes and tests were reviewed. The plan was reviewed with the patient/family, and all questions/concerned were addressed.  It was my pleasure  to see Jessica Andersen today and participate in her care. Please feel free to contact me with any questions or concerns.  Sincerely,  Wyline Mood, DO Allergy & Immunology  [facility] office: [phone] [facility] office: [phone]

## 2023-04-24 NOTE — Progress Notes (Unsigned)
 Follow Up Note  RE: Jessica Andersen MRN: [id number] DOB: 11-Jan-1996 Date of Office Visit: 04/25/2023  Referring provider: Loleta Dicker, FNP Primary care provider: Loleta Dicker, FNP  Chief Complaint: No chief complaint on file.  History of Present Illness: I had the pleasure of seeing Jessica Andersen for a follow up visit at the [facility] on 04/24/2023. She is a 28 y.o. female, who is being followed for chronic urticaria on Xolair, asthma, adverse food reaction, allergic rhinitis, lip swelling. Her previous allergy office visit was on 12/27/2022 with Dr. Selena Batten. Today is a regular follow up visit.  Discussed the use of AI scribe software for clinical note transcription with the patient, who gave verbal consent to proceed.  History of Present Illness            ***  Assessment and Plan: Jessica Andersen is a 28 y.o. female with: Chronic urticaria Past history - hives for 20+ years. Last year she was worked up by [employer] and was started on Xolair 300mg  every 4 weeks with good benefit. Last injection was about 3 months ago and noticing some milder symptoms 1-2 times per week. Patient would like to restart Xolair. Tried OTC antihistamines, Singulair and famotidine with no benefit. Only prednisone helped in the past. 2024 Bloodwork (CBC diff, CMP, TSH, ANA, ESR, CRP, CU, tryptase, Alpha gal) all normal.  Interim history - Missed Xolair injection, resulting in increased itchiness and possible lip swelling. Xolair generally effective in reducing symptoms. Does not like to take antihistamines as they dry her out too much. Restart Xolair 300mg  every 4 weeks - at home. Avoid the following potential triggers: alcohol, tight clothing, NSAIDs, hot showers and getting overheated. During flares:  Start zyrtec (cetirizine) 10mg  or allegra 180mg  once a day and may take twice a day during flares.  If symptoms are not controlled or causes drowsiness let us know. Start taking famotine 20mg  twice  a day. May take hydroxyzine as needed for itching/hives/anxiety.   Mild intermittent asthma without complication Past history - diagnosed with asthma as a young child. Main triggers: dust and exertion. Uses albuterol less than once per month. 2024 spirometry was normal. Interim history - well controlled.  May use albuterol rescue inhaler 2 puffs every 4 to 6 hours as needed for shortness of breath, chest tightness, coughing, and wheezing. May use albuterol rescue inhaler 2 puffs 5 to 15 minutes prior to strenuous physical activities. Monitor frequency of use - if you need to use it more than twice per week on a consistent basis let us know.    Other adverse food reactions, not elsewhere classified, subsequent encounter Reports tingling sensation in mouth after consuming Hershey's chocolate, but not other chocolate brands. Avoid Hershey's chocolate.   Seasonal allergic rhinitis due to pollen Allergic rhinitis due to dust mite Allergic rhinitis due to animal dander Allergic rhinitis due to mold Past history - some mild rhinoconjunctivitis symptoms and takes over-the-counter antihistamines with good benefit.  Allergy testing positive to dust mites and trees. On AIT briefly from 2011 to 2012 (T-G-W and Dm-D-M). Interim history - minimal symptoms.  Continue environmental control measures. May use over the counter antihistamines such as Zyrtec (cetirizine), Claritin (loratadine), Allegra (fexofenadine), or Xyzal (levocetirizine) daily as needed.    Lip swelling Not sure if it's from trauma, chocolate protein shake, missed Xolair dosing or ace-inhibitor induced.  Monitor symptoms. Consider stopping lisinopril if lip swelling recurs as this medication can cause lip swelling. Assessment and  Plan              No follow-ups on file.  No orders of the defined types were placed in this encounter.  Lab Orders  No laboratory test(s) ordered today    Diagnostics: Spirometry:  Tracings  reviewed. Her effort: {Blank single:19197::"Good reproducible efforts.","It was hard to get consistent efforts and there is a question as to whether this reflects a maximal maneuver.","Poor effort, data can not be interpreted."} FVC: ***L FEV1: ***L, ***% predicted FEV1/FVC ratio: ***% Interpretation: {Blank single:19197::"Spirometry consistent with mild obstructive disease","Spirometry consistent with moderate obstructive disease","Spirometry consistent with severe obstructive disease","Spirometry consistent with possible restrictive disease","Spirometry consistent with mixed obstructive and restrictive disease","Spirometry uninterpretable due to technique","Spirometry consistent with normal pattern","No overt abnormalities noted given today's efforts"}.  Please see scanned spirometry results for details.  Skin Testing: {Blank single:19197::"Select foods","Environmental allergy panel","Environmental allergy panel and select foods","Food allergy panel","None","Deferred due to recent antihistamines use"}. *** Results discussed with patient/family.   Medication List:  Current Outpatient Medications  Medication Sig Dispense Refill   albuterol (VENTOLIN HFA) 108 (90 Base) MCG/ACT inhaler Inhale 2 puffs into the lungs every 4 (four) hours as needed for wheezing or shortness of breath (coughing fits). 18 g 1   Blood Glucose Monitoring Suppl (ACCU-CHEK GUIDE) w/Device KIT Use as directed by physician to monitor blood sugars     calcium acetate (PHOSLO) 667 MG capsule Take by mouth.     clonazePAM (KLONOPIN) 0.5 MG disintegrating tablet Take 1 tablet (0.5 mg total) by mouth 2 (two) times daily as needed (anxiety). 60 tablet 2   FLUoxetine (PROZAC) 40 MG capsule Take by mouth.     fluticasone (FLONASE) 50 MCG/ACT nasal spray Place 1 spray into both nostrils 2 (two) times daily. 16 g 5   fluticasone (FLOVENT HFA) 220 MCG/ACT inhaler Inhale into the lungs.     glucose blood test strip Use to accu-chek  guide test strips to monitor blood glucose 4 times daily     hydrocortisone 2.5 % cream Apply a small amount to affected area twice a day as needed     hydrOXYzine (VISTARIL) 25 MG capsule Take by mouth.     labetalol (NORMODYNE) 100 MG tablet Take by mouth.     lisinopril (ZESTRIL) 5 MG tablet Take 5 mg by mouth daily.     Magnesium 250 MG TABS Take 1 tablet by mouth daily.     metoCLOPramide (REGLAN) 10 MG tablet Take by mouth.     metroNIDAZOLE (FLAGYL) 500 MG tablet Take 500 mg by mouth 2 (two) times daily.     orphenadrine (NORFLEX) 100 MG tablet Take by mouth.     Oxcarbazepine (TRILEPTAL) 300 MG tablet Take 1 tablet (300 mg total) by mouth 2 (two) times daily. 60 tablet 2   phentermine (ADIPEX-P) 37.5 MG tablet Break the tablet in half. Take one half tablet 30 minutes before breakfast and the remaining half tablet 30 minutes before lunch.     prazosin (MINIPRESS) 1 MG capsule Take 1 capsule (1 mg total) by mouth at bedtime. 30 capsule 2   topiramate (TOPAMAX) 25 MG tablet Take by mouth.     traZODone (DESYREL) 50 MG tablet Take 50 mg by mouth at bedtime.     Vitamin D, Ergocalciferol, 50000 units CAPS Take 1 capsule by mouth 2 (two) times a week.     No current facility-administered medications for this visit.   Allergies: Allergies  Allergen Reactions   Other Hives    Environmental (  Trees, grasses, pollen).   Baby Wipes Other (See Comments)    Burning sensation   Dexamethasone Other (See Comments)    "extreme burning sensation"   Icy Hot Hives   Penicillins Hives   Wound Dressings Hives    Super duty band-aid adhesive, not allergic to regular adhesive   I reviewed her past medical history, social history, family history, and environmental history and no significant changes have been reported from her previous visit.  Review of Systems  Objective: There were no vitals taken for this visit. There is no height or weight on file to calculate BMI. Physical Exam Previous  notes and tests were reviewed. The plan was reviewed with the patient/family, and all questions/concerned were addressed.  It was my pleasure to see Jessica Andersen today and participate in her care. Please feel free to contact me with any questions or concerns.  Sincerely,  Wyline Mood, DO Allergy & Immunology  [facility] office: [phone] [facility] office: [phone]

## 2023-04-25 ENCOUNTER — Ambulatory Visit: Payer: BC Managed Care – PPO | Admitting: Allergy

## 2023-04-25 ENCOUNTER — Encounter: Payer: Self-pay | Admitting: Allergy

## 2023-04-25 VITALS — BP 100/70 | HR 90 | Temp 98.3°F | Resp 18 | Wt 187.5 lb

## 2023-04-25 DIAGNOSIS — L508 Other urticaria: Secondary | ICD-10-CM

## 2023-04-25 DIAGNOSIS — J3089 Other allergic rhinitis: Secondary | ICD-10-CM | POA: Diagnosis not present

## 2023-04-25 DIAGNOSIS — J301 Allergic rhinitis due to pollen: Secondary | ICD-10-CM | POA: Diagnosis not present

## 2023-04-25 DIAGNOSIS — J3081 Allergic rhinitis due to animal (cat) (dog) hair and dander: Secondary | ICD-10-CM

## 2023-04-25 DIAGNOSIS — J4520 Mild intermittent asthma, uncomplicated: Secondary | ICD-10-CM | POA: Diagnosis not present

## 2023-04-25 MED ORDER — AIRSUPRA 90-80 MCG/ACT IN AERO: 2.0000 | INHALATION_SPRAY | RESPIRATORY_TRACT | 2 refills | Status: AC | PRN

## 2023-04-25 NOTE — Patient Instructions (Addendum)
 Hives Restart Xolair 300mg  every 4 weeks - at home. Avoid the following potential triggers: alcohol, tight clothing, NSAIDs, hot showers and getting overheated.  During flares:  Start zyrtec (cetirizine) 10mg  or allegra 180mg  once a day and may take twice a day during flares.  If symptoms are not controlled or causes drowsiness let us know. Start taking famotine 20mg  twice a day. May take hydroxyzine as needed for itching/hives/anxiety.  Asthma May use Airsupra rescue inhaler 2 puffs every 4 to 6 hours as needed for shortness of breath, chest tightness, coughing, and wheezing. Do not use more than 12 puffs in 24 hours. May use Airsupra rescue inhaler 2 puffs 5 to 15 minutes prior to strenuous physical activities. Rinse mouth after each use.  Coupon given.  If this is NOT covered let me know.  Monitor frequency of use - if you need to use it more than twice per week on a consistent basis let us know.   Environmental allergies Continue environmental control measures. May use over the counter antihistamines such as Zyrtec (cetirizine), Claritin (loratadine), Allegra (fexofenadine), or Xyzal (levocetirizine) daily as needed.  Start Ryaltris (olopatadine + mometasone nasal spray combination) 1-2 sprays per nostril twice a day. Sample given. Nasal saline spray (i.e., Simply Saline) or nasal saline lavage (i.e., NeilMed) is recommended as needed and prior to medicated nasal sprays.  Follow up in 4 months or sooner if needed.    Follow up with neurology regarding the headaches.  Follow up with OB/GYN regarding menorrhagia.

## 2023-05-06 ENCOUNTER — Other Ambulatory Visit: Payer: Self-pay | Admitting: Allergy
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
## 2023-08-27 ENCOUNTER — Ambulatory Visit: Admitting: Allergy

## 2023-08-27 DIAGNOSIS — L508 Other urticaria: Secondary | ICD-10-CM

## 2023-08-27 DIAGNOSIS — J4520 Mild intermittent asthma, uncomplicated: Secondary | ICD-10-CM

## 2023-08-27 DIAGNOSIS — J301 Allergic rhinitis due to pollen: Secondary | ICD-10-CM

## 2023-08-27 DIAGNOSIS — J3081 Allergic rhinitis due to animal (cat) (dog) hair and dander: Secondary | ICD-10-CM

## 2023-08-27 DIAGNOSIS — J3089 Other allergic rhinitis: Secondary | ICD-10-CM

## 2023-11-26 NOTE — Progress Notes (Signed)
 [facility] [place] NEW PATIENT EVALUATION   Referring Physician:  Job Jessica Andersen DEVONNA Primary Care Physician:  Jessica Andersen Job, PA-C   Patient ID:  Jessica Andersen is a 28 y.o. (DOB 1995-03-28) female.    Subjective   HPI:  Ms. Jessica Andersen is a 28 y.o. female who presents for follow-up.  Patient is continuing to endorse multiple seizures occurring on a fairly frequent basis.  She is also continue to experience frequent headaches, cervical pain, and bilateral occipital neuralgia.  However, the patient is more concerned about the seizure like activity that she is experiencing.  She reports that she had 11 episodes within a short duration during 1 night.  She does endorse a loss of consciousness with a generalized tonic-clonic type movement.  She also recently went to a concert and states that she had 3 seizure-like episodes at a concert.  She does endorse some tenderness along her VP shunt.  She has recently been evaluated in the emergency department as well as with neurosurgery.  She has had a recent shunt series.  No abnormalities are reported to her shunt.  Patient feels that she might have an infection as she has been having some sharp pains associated with the region that her shunt has been as well as some fevers up to 102 degrees.  Patient is continuing on Lamictal, Briviact, and Topamax.  She does report not taking these consistently.  She does endorse that once beginning these medications she has had some decrease intensity.  Patient is also taking a THC/CBD gummy which she feels is offering some benefit as well.  She has not been able to see psychology or psychiatry as it is cost prohibitive to her.  Frequency d/wk: 7. Recent injection/procedures: None. Current Prophylaxis: Topamax. Current Abortives: Maxalt. PT/Home exercise program? No. Nausea/Vomiting Present?: Yes. Recent Imaging?: None.          CURRENT AND PAST MEDICATIONS:  Analgesics:  Acetaminophen  Anti-migraine: Maxalt, Imitrex, Ubrelvy (IE)  Heart/bp:     Decongestant/ antihistamine:    Anti-nauseant:    Nsaids: Toradol IM  Muscle relaxants: Tizanidine, Robaxin, Flexeril, baclofen  Anti-convulsants: Topamax, Briviact  Steroids:    Sleeping pills/ tranquilizers:    Anti-depressants: Amitriptyline (SE, IE)  Herbal:     Fibromyalgia:     Hormonal:    Other: Qulipta  Procedures for headaches: Occipital nerve block     Past Medical History, Past Surgery History, Social History, and Family History were reviewed and updated.    Past Medical History:  Diagnosis Date  . ADHD (attention deficit hyperactivity disorder)   . Anxiety   . Asthma (*)   . Borderline personality disorder (*)   . Depression   . History of postpartum depression, currently pregnant (*) 04/17/2022   G3    . 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 Hypertension   . IIH (idiopathic intracranial hypertension)   . IUFD at less than 20 weeks of gestation (*) 06/11/2022  . Migraine   . 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 Nephrolithiasis   . PCOS (polycystic ovarian syndrome) 2021  . Postpartum depression    G3    Past Surgical History:  Procedure Laterality Date  . Abscess drainage    . Colonoscopy  07/16/2019  . Dilatation and evacuation  03/01/2023   Jessica [place], DO  . Dilation and curettage of uterus  05/2022  . Shunt revision  03/21/2021  .  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 Tooth extraction    . Upper gastrointestinal endoscopy  07/16/2019  . 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 Wisdom tooth extraction     Family History  Problem Relation Age of Onset  . Diabetes Paternal Grandfather   . Leukemia Maternal Grandmother 37  . Hypertension Maternal Grandfather   . Diabetes Maternal Grandfather   . Stroke Father   . Hypertension Father   . Heart disease Father   . Cancer Mother 62       Cervical Cancer  .  Hypertension Mother   . Anxiety disorder Mother   . Migraines Mother   . Migraines Brother   . Seizures Neg Hx   . Breast cancer Neg Hx   . Colon cancer Neg Hx   . Ovarian cancer Neg Hx    Social History   Socioeconomic History  . Marital status: Married    Spouse name: Dorn  . Number of children: 1  Occupational History  . Occupation: stay at home mom  Tobacco Use  . Smoking status: Never    Passive exposure: Current  . Smokeless tobacco: Never  . Tobacco comments:    never smoker  Vaping Use  . Vaping status: Never Used  Substance and Sexual Activity  . Alcohol use: Not Currently    Alcohol/week: 1.0 standard drink of alcohol    Types: 1 Shots of liquor per week    Comment: Drinks occassionally  . Drug use: Not Currently    Types: Marijuana    Comment: THC-Eats edibles - does recreationally  . Sexual activity: Yes    Partners: Male      Current Home Medications  Medication Sig  ACCU-CHEK FASTCLIX LANCETS MISC Use to monitor blood glucose 3 time(s) daily  Albuterol -Budesonide (AIRSUPRA ) 90-80 MCG/ACT AERO Inhale 2 puffs into the lungs.  amphetamine-dextroamphetamine (ADDERALL XR) 20 mg 24 hr capsule Take one capsule (20 mg dose) by mouth every morning. Max Daily Amount: 20 mg  Blood Glucose Monitoring Suppl (ACCU-CHEK GUIDE) w/Device KIT 1 each by Does not apply route 3 (three) times a day.  brivaracetam (BRIVIACT) 50 MG TABS tablet Take one tablet (50 mg dose) by mouth 2 (two) times daily. Max Daily Amount: 100 mg  busPIRone (BUSPAR) 15 MG tablet Take one tablet (15 mg dose) by mouth 3 (three) times a day as needed (anxiety).  calcium acetate (PHOSLO) 667 mg capsule Take by mouth.  docusate sodium (COLACE,DOK,DOCQLACE) capsule Take one capsule (100 mg dose) by mouth 2 (two) times daily.  fluoxetine (PROZAC) 40 MG capsule Take two capsules (80 mg dose) by mouth daily.  Glucagon (BAQSIMI TWO PACK) 3 MG/DOSE POWD 1 spray by Nasal route as needed.  glucose blood  (ACCU-CHEK GUIDE) test strip Use to monitor blood glucose 3 time(s) daily  glucose blood test strip   hydrOXYzine pamoate (VISTARIL) 25 mg capsule Take two capsules (50 mg dose) by mouth 4 (four) times a day as needed for Anxiety.  ibuprofen (ADVIL,MOTRIN) 800 mg tablet Take one tablet (800 mg dose) by mouth every 8 (eight) hours as needed for Pain.  iron polysaccharides (HYTINIC,FERREX,IFEREX,POLY-IRON) 150 MG capsule Take one capsule (150 mg dose) by mouth 2 (two) times daily.  ketoconazole (NIZORAL) 2 % cream Apply topically daily.  lamoTRIgine (LAMICTAL) 25 mg tablet Take two tablets (50 mg dose) by mouth daily.  Magnesium 250 MG TABS Take one tablet by mouth daily.  methocarbamol (ROBAXIN) 500 mg tablet Take one tablet (  500 mg dose) by mouth 3 (three) times a day.  metoclopramide HCl (REGLAN) 10 mg tablet Take one tablet (10 mg dose) by mouth 2 (two) times a day as needed.  Misc. Devices MISC Accu-check test strips. Test blood sugar twice daily.  Multiple Vitamin (MULTIVITAMIN) tablet Take one tablet by mouth daily.  naproxen (NAPROSYN) 500 mg tablet Take one tablet (500 mg dose) by mouth 2 (two) times daily with meals for 10 days.  ondansetron (ZOFRAN) 4 mg tablet Take one tablet (4 mg dose) by mouth 3 (three) times a day as needed for Nausea.  oxyCODONE HCl (OXYCONTIN) 10 mg 12 hr tablet Take one tablet (10 mg dose) by mouth daily. Per pt she takes a 1/2 tablet ( up to 6 times per day). Medication is prescribed through [facility] Patient not taking: Reported on 11/26/2023  polyethylene glycol (MIRALAX) 17 g packet 1-2 packets in water or juice daily.  predniSONE (DELTASONE) 5 mg tablet Take one tablet (5 mg dose) by mouth daily.  pregabalin (LYRICA) 50 mg capsule Take one capsule (50 mg dose) by mouth 3 (three) times a day.  QULIPTA 60 MG TABS tablet TAKE ONE TABLET BY MOUTH DAILY  rizatriptan (MAXALT) 10 MG tablet Take by mouth.  SUMAtriptan succinate (IMITREX) 100 mg tablet  Take one tablet (100 mg dose) by mouth once as needed for Migraine for up to 1 dose.  tiZANidine (ZANAFLEX) 4 mg tablet Take two tablets (8 mg dose) by mouth 3 (three) times a day as needed.  topiramate (TOPAMAX) 50 MG tablet Take three tablets (150 mg dose) by mouth at bedtime.  traZODone (DESYREL) 100 mg tablet Take one tablet to two tablets (100-200 mg dose) by mouth at bedtime.  vitamin B-6 (PYRIDOXINE) 25 mg tablet Take one tablet (25 mg dose) by mouth 3 (three) times a day.  XOLAIR 150 MG/ML SOSY injection Inject 1 mL (150 mg dose) into the skin every 28 (twenty-eight) days.    The patient is allergic to penicillins, allevyn adhesive [wound dressings], amoxicillin, baby wipes, decadron [dexamethasone], icy hot, and lisinopril.  Review of Systems is complete and negative except as noted in History of Present Illness.  Objective    PHYSICAL EXAM: BP 110/86 (BP Location: Left Upper Arm, Patient Position: Sitting)   Pulse 92   Ht 5' 2.5 (1.588 m)   Wt 170 lb 9.6 oz (77.4 kg)   SpO2 99%   BMI 30.71 kg/m  GENERAL: Awake, alert, in no acute distress Psych: Affect appropriate for situation, patient is calm and cooperative with examination Head: Normocephalic and atraumatic, without obvious abnormality EENT: Normal conjunctivae, dry mucous membranes, no OP obstruction LUNGS: Normal respiratory effort. Non-labored breathing on room air. Clear to auscultation bilaterally. CV: Auscultation: regular rate and rhythm. Extremities: Warm, well perfused, without obvious deformity  MENTAL STATUS EXAM: Orientation: Alert and oriented to person, place and time. Memory: Cooperative, follows commands well. Recent and remote memory normal.. Attention, concentration: Attention span and concentration are normal. Language: Speech is clear and language is normal. Fund of knowledge: Aware of current events, vocabulary appropriate for patient age.   CRANIAL NERVES: CN 2 (Optic): Visual fields intact  to confrontation. CN 3,4,6 (EOM): Pupils equal and reactive to light. Full extraocular eye movement without nystagmus. CN 5 (Trigeminal): Facial sensation is normal, no weakness of masticatory muscles. CN 7 (Facial): No facial weakness or asymmetry. CN 8 (Auditory): Auditory acuity grossly normal. CN 9,10 (Glossophar): The uvula is midline, the palate elevates symmetrically. CN  11 (spinal access): Normal sternocleidomastoid and trapezius strength. CN 12 (Hypoglossal): The tongue is midline. No atrophy or fasciculations.   MOTOR: Muscle Strength: Strength - 5/5 and symmetric in the upper and lower extremities, no pronation or drift. Muscle Tone: Tone and muscle bulk are normal in the upper and lower extremities.   REFLEXES:   DTRs - 2+ and symmetrical in all four extremities, plantar responses are flexor bilaterally.   COORDINATION:   Intact finger-to-nose, heel-to-shin, and rapid alternating movements, no tremor.   SENSATION:  Intact to light touch, vibration, pinprick.  No sensory extinction to DSS. Negative Romberg test.  GAIT: Routine gait normal.   DIAGNOSTIC STUDIES:    EEG Awake and Drowsy - 11/12/2023 -Interpretation:  This is normal awake, drowsy and asleep EEG.  No evidence of abnormal activity is noted during this time limited EEG monitoring.   Clinical Correlation: Normal EEG recording. A normal EEG does not rule out epilepsy -While a normal EEG does not rule out epilepsy, there was no evidence of an underlying seizure disorder on this study.   CT Head w/o contrast - 09/26/2023 -No acute intracranial abnormality. Ventricles remain small.   NCV/EMG BUE,BLE - 08/15/2023 -This is a normal study.  At this time there is no evidence of a widespread peripheral neuropathy, lumbosacral radiculopathy, or myopathy.  XR Cervical Spine - 05/08/2023 - Right-sided shunt catheter. Neural foramina are widely patent. Loss of normal cervical lordosis. No degenerative change. Prevertebral soft  tissues are within normal limits   MRI Head w/o contrast - 04/05/2023 -No acute intracranial abnormality - Right frontal ventriculoperitoneal shunt in place. The ventricles are normal.   Lumbar puncture-10/01/2022 1. Opening pressures: 180 mmH2O 2. Closing pressures: 122 mmH2O 3. Total amount of CSF removed: 12 cc 4. CSF appearance: Clear and colorless fluid     MRI pituitary w/ and w/o contrast - 10/20/2021 -No acute intracranial abnormality  LABORATORY STUDIES:   Lab on 11/15/2023  Component Date Value Ref Range Status  . Glucose 11/15/2023 88  70 - 99 mg/dL Final  . Glucose 90/73/7974 88  70 - 99 mg/dL Final  . Glucose 90/73/7974 85  70 - 99 mg/dL Final  . Glucose 90/73/7974 88  70 - 99 mg/dL Final  . Glucose 90/73/7974 86  70 - 99 mg/dL Final  . Glucose 90/73/7974 86  70 - 99 mg/dL Final  Office Visit on 10/23/2023  Component Date Value Ref Range Status  . Vitamin B1 10/23/2023 171.3  66.5 - 200.0 nmol/L Final  . Zinc 10/23/2023 62  44 - 115 ug/dL Final  . Ceruloplasmin 10/23/2023 28.5  19.0 - 39.0 mg/dL Final  . Vitamin B6 90/96/7974 23.4  3.4 - 65.2 ug/L Final  . WBC 10/23/2023 8.0  3.4 - 10.8 x10E3/uL Final  . RBC 10/23/2023 4.49  3.77 - 5.28 x10E6/uL Final  . Hemoglobin 10/23/2023 13.6  11.1 - 15.9 g/dL Final  . Hematocrit 90/96/7974 41.8  34.0 - 46.6 % Final  . MCV 10/23/2023 93  79 - 97 fL Final  . MCH 10/23/2023 30.3  26.6 - 33.0 pg Final  . MCHC 10/23/2023 32.5  31.5 - 35.7 g/dL Final  . RDW 90/96/7974 13.0  11.7 - 15.4 % Final  . Platelet Count 10/23/2023 239  150 - 450 x10E3/uL Final  . Neutrophils 10/23/2023 68  Not Estab. % Final  . Lymphs Relative 10/23/2023 24  Not Estab. % Final  . Monocytes 10/23/2023 6  Not Estab. % Final  . Eos Relative 10/23/2023  1  Not Estab. % Final  . Basos Relative  10/23/2023 1  Not Estab. % Final  . Neutrophils Absolute 10/23/2023 5.4  1.4 - 7.0 x10E3/uL Final  . Lymphocytes Absolute 10/23/2023 2.0  0.7 - 3.1 x10E3/uL Final  .  Monocytes Absolute 10/23/2023 0.5  0.1 - 0.9 x10E3/uL Final  . Eosinophils Absolute 10/23/2023 0.1  0.0 - 0.4 x10E3/uL Final  . Basophils Absolute 10/23/2023 0.0  0.0 - 0.2 x10E3/uL Final  . Immature Granulocytes 10/23/2023 0  Not Estab. % Final  . Immature Grans (Abs) 10/23/2023 0.0  0.0 - 0.1 x10E3/uL Final  . Glucose 10/23/2023 67 (L)  70 - 99 mg/dL Final  . BUN 90/96/7974 15  6 - 20 mg/dL Final  . Creatinine 90/96/7974 0.80  0.57 - 1.00 mg/dL Final  . eGFR 90/96/7974 103  >59 mL/min/1.73 Final  . BUN/Creatinine Ratio 10/23/2023 19  9 - 23 Final  . Sodium 10/23/2023 141  134 - 144 mmol/L Final  . Potassium 10/23/2023 4.6  3.5 - 5.2 mmol/L Final  . Chloride 10/23/2023 107 (H)  96 - 106 mmol/L Final  . CO2 10/23/2023 22  20 - 29 mmol/L Final  . CALCIUM 10/23/2023 9.0  8.7 - 10.2 mg/dL Final  . Total Protein 10/23/2023 6.3  6.0 - 8.5 g/dL Final  . Albumin, Serum 10/23/2023 4.2  4.0 - 5.0 g/dL Final  . Globulin, Total 10/23/2023 2.1  1.5 - 4.5 g/dL Final  . Total Bilirubin 10/23/2023 0.4  0.0 - 1.2 mg/dL Final  . Alkaline Phosphatase 10/23/2023 72  44 - 121 IU/L Final  . AST 10/23/2023 17  0 - 40 IU/L Final  . ALT (SGPT) 10/23/2023 19  0 - 32 IU/L Final  . Prealbumin 10/23/2023 29  14 - 35 mg/dL Final  . Ferritin 90/96/7974 14 (L)  15 - 150 ng/mL Final  . Vit D, 25-Hydroxy 10/23/2023 47.8  30.0 - 100.0 ng/mL Final  . Vitamin B-12 10/23/2023 553  232 - 1,245 pg/mL Final  . Folate 10/23/2023 16.4  >3.0 ng/mL Final  . Vitamin E (Alpha Tocopherol) 10/23/2023 13.6  5.9 - 19.4 mg/L Final  . Vitamin E (Gamma Tocopherol) 10/23/2023 0.8  0.7 - 4.9 mg/L Final  . Vatamin A Level 10/23/2023 56.3  18.9 - 57.3 ug/dL Final  Admission on 91/92/7974, Discharged on 09/26/2023  Component Date Value Ref Range Status  . Glucose, POC 09/26/2023 83  70 - 99 mg/dL Final  . OPERATOR ID 91/92/7974 [id number]   Final  . INSTRUMENT ID 09/26/2023 [id number]   Final  . WBC 09/26/2023 10.5  4.0 - 10.5 thou/mcL  Final  . RBC 09/26/2023 5.01  3.93 - 5.22 million/mcL Final  . HGB 09/26/2023 15.2  11.2 - 15.7 gm/dL Final  . HCT 91/92/7974 44.2  34.1 - 44.9 % Final  . MCV 09/26/2023 88.2  79.4 - 94.8 fL Final  . MCH 09/26/2023 30.3  25.6 - 32.2 pg Final  . MCHC 09/26/2023 34.4  32.2 - 35.5 gm/dL Final  . Plt Ct 91/92/7974 240  150 - 400 thou/mcL Final  . RDW SD 09/26/2023 42.5  35.1 - 46.3 fL Final  . MPV 09/26/2023 10.8  9.4 - 12.4 fL Final  . NRBC% 09/26/2023 0.0  0.0 - 0.2 /100WBC Final  . Absolute NRBC Count 09/26/2023 0.00  0.00 - 0.01 thou/mcL Final  . NEUTROPHIL % 09/26/2023 61.1  % Final  . LYMPHOCYTE % 09/26/2023 27.4  % Final  . MONOCYTE % 09/26/2023 7.5  %  Final  . Eosinophil % 09/26/2023 2.8  % Final  . BASOPHIL % 09/26/2023 0.3  % Final  . IG% 09/26/2023 0.9  % Final  . ABSOLUTE NEUTROPHIL COUNT 09/26/2023 6.42  1.50 - 7.50 thou/mcL Final  . ABSOLUTE LYMPHOCYTE COUNT 09/26/2023 2.88  1.00 - 4.50 thou/mcL Final  . Absolute Monocyte Count 09/26/2023 0.79  0.10 - 0.80 thou/mcL Final  . Absolute Eosinophil Count 09/26/2023 0.29  0.00 - 0.50 thou/mcL Final  . Absolute Basophil Count 09/26/2023 0.03  0.00 - 0.20 thou/mcL Final  . Absolute Immature Granulocyte Count 09/26/2023 0.09 (H)  0.00 - 0.03 thou/mcL  Final  . Na 09/26/2023 140  136 - 146 mmol/L Final  . Potassium 09/26/2023 3.2 (L)  3.7 - 5.4 mmol/L Final  . Cl 09/26/2023 103  97 - 108 mmol/L Final  . CO2 09/26/2023 19 (L)  20 - 32 mmol/L Final  . AGAP 09/26/2023 18 (H)  7 - 16 mmol/L Final  . Glucose 09/26/2023 88  65 - 99 mg/dL Final  . BUN 91/92/7974 11  6 - 20 mg/dL Final  . Creatinine 91/92/7974 0.78  0.57 - 1.00 mg/dL Final  . Ca 91/92/7974 9.2  8.7 - 10.2 mg/dL Final  . ALK PHOS 91/92/7974 83  25 - 150 U/L Final  . T Bili 09/26/2023 0.6  0.0 - 1.2 mg/dL Final  . Total Protein 09/26/2023 7.5  6.0 - 8.5 gm/dL Final  . Alb 91/92/7974 4.5  3.5 - 5.5 gm/dL Final  . GLOBULIN 91/92/7974 3.0  1.5 - 4.5 gm/dL Final  .  ALBUMIN/GLOBULIN RATIO 09/26/2023 1.5  1.1 - 2.5 Final  . BUN/CREAT RATIO 09/26/2023 14.1  11.0 - 26.0 Final  . ALT 09/26/2023 29  0 - 40 U/L Final  . AST 09/26/2023 16  0 - 40 U/L Final  . eGFR 09/26/2023 107  >=60 mL/min/1.78m2 Final  . TSH 09/26/2023 3.39  0.45 - 4.50 mcIU/mL Final  . Beta HCG Qual 09/26/2023 Negative  Negative Final  . Blood Culture 09/26/2023 No growth at 5 days   Final  . Blood Culture 09/26/2023 No growth at 5 days   Final  . Ur PH DOA Scr 09/26/2023 6.5  4.5 - 9.0 Final  . [place] Scr 09/26/2023 Positive (A)  Negative Final  . Barb Scr 09/26/2023 Negative  Negative Final  . Benzo Scr 09/26/2023 Negative  Negative Final  . Cannab Scr 09/26/2023 Positive (A)  Negative Final  . Cocaine Scr 09/26/2023 Negative  Negative Final  . Opiates Scr 09/26/2023 Negative  Negative Final  . Meth Scr 09/26/2023 Negative  Negative Final  . Oxyco Scr 09/26/2023 Positive (A)  Negative Final  . Fentanyl Scr 09/26/2023 Negative  Negative Final  . Buprenorphine Screen 09/26/2023 Negative  Negative Final  . Urine Color 09/26/2023 Yellow  Yellow  Final  . Urine Clarity 09/26/2023 Clear  Clear Final  . Urine Specific Gravity 09/26/2023 1.014  1.005 - 1.030 Final  . Urine pH 09/26/2023 6.5  5 to 9 Final  . Urine Protein - Dipstick 09/26/2023 Negative  Negative mg/dl Final  . Urine Glucose 09/26/2023 Negative  Negative mg/dL Final  . Urine Ketones 09/26/2023 Negative  Negative mg/dl Final  . Urine Bilirubin 09/26/2023 Negative  Negative mg/dL Final  . Urine Blood 91/92/7974 0.03 (A)  Negative mg/dL Final  . Urine Nitrite 09/26/2023 Negative  Negative Final  . Urine Urobilinogen 09/26/2023 <2  <2 mg/dl Final  . Urine Leukocyte Esterase 09/26/2023 Negative  Negative Leu/mcL Final  .  Urine Squamous Epithelial Cells 09/26/2023 3-4 (A)  0 - 2 /HPF Final  . Urine WBC 09/26/2023 0-2  0 - 2 /hpf Final  . Urine RBC 09/26/2023 0-2  0 - 2 /HPF Final  . Urine Bacteria 09/26/2023 None  None /HPF  Final  . UA Microscopic 09/26/2023 Yes Micro (A)  No Micro Final  . Glucose, POC 09/26/2023 107 (H)  70 - 99 mg/dL Final  . OPERATOR ID 91/92/7974 [id number]   Final  . INSTRUMENT ID 09/26/2023 [id number]   Final  . Phos 09/26/2023 2.2 (L)  2.5 - 4.5 mg/dL Final  . Mg 91/92/7974 1.9  1.6 - 2.6 mg/dL Final  Ancillary Procedure on 08/14/2023  Component Date Value Ref Range Status  . Blood Pressure Monitoring 08/14/2023 110/74   Final  . Pulse 08/14/2023 87   Final  . Pulse Ox 08/14/2023 98   Final  Lab on 08/13/2023  Component Date Value Ref Range Status  . Cholesterol, Total 08/13/2023 196  100 - 199 mg/dL Final  . Triglycerides 08/13/2023 71  0 - 149 mg/dL Final  . HDL 93/75/7974 56  >39 mg/dL Final  . VLDL Cholesterol Cal 08/13/2023 13  5 - 40 mg/dL Final  . LDL 93/75/7974 127 (H)  0 - 99 mg/dL Final  . Vit D, 74-Ybimnkb 08/13/2023 44.0  30.0 - 100.0 ng/mL Final  . Prealbumin 08/13/2023 23  14 - 35 mg/dL Final  . Ferritin 93/75/7974 16  15 - 150 ng/mL Final  . Glucose 08/13/2023 62 (L)  70 - 99 mg/dL Final  . BUN 93/75/7974 13  6 - 20 mg/dL Final  . Creatinine 93/75/7974 0.83  0.57 - 1.00 mg/dL Final  . eGFR 93/75/7974 99  >59 mL/min/1.73 Final  . Interpretation 08/13/2023 Comment   Final  . BUN/Creatinine Ratio 08/13/2023 16  9 - 23 Final  . Sodium 08/13/2023 143  134 - 144 mmol/L Final  . Potassium 08/13/2023 4.3  3.5 - 5.2 mmol/L Final  . Chloride 08/13/2023 109 (H)  96 - 106 mmol/L Final  . CO2 08/13/2023 19 (L)  20 - 29 mmol/L Final  . CALCIUM 08/13/2023 9.1  8.7 - 10.2 mg/dL Final  . Total Protein 08/13/2023 6.5  6.0 - 8.5 g/dL Final  . Albumin, Serum 08/13/2023 4.2  4.0 - 5.0 g/dL Final  . Globulin, Total 08/13/2023 2.3  1.5 - 4.5 g/dL Final  . Total Bilirubin 08/13/2023 0.6  0.0 - 1.2 mg/dL Final  . Alkaline Phosphatase 08/13/2023 78  44 - 121 IU/L Final  . AST 08/13/2023 14  0 - 40 IU/L Final  . ALT (SGPT) 08/13/2023 19  0 - 32 IU/L Final  . Vitamin B-12 08/13/2023  592  232 - 1,245 pg/mL Final  . Folate 08/13/2023 >20.0  >3.0 ng/mL Final  . WBC 08/13/2023 8.0  3.4 - 10.8 x10E3/uL Final  . RBC 08/13/2023 4.70  3.77 - 5.28 x10E6/uL Final  . Hemoglobin 08/13/2023 14.2  11.1 - 15.9 g/dL Final  . Hematocrit 93/75/7974 43.8  34.0 - 46.6 % Final  . MCV 08/13/2023 93  79 - 97 fL Final  . MCH 08/13/2023 30.2  26.6 - 33.0 pg Final  . MCHC 08/13/2023 32.4  31.5 - 35.7 g/dL Final  . RDW 93/75/7974 12.9  11.7 - 15.4 % Final  . Platelet Count 08/13/2023 194  150 - 450 x10E3/uL Final  . Neutrophils 08/13/2023 56  Not Estab. % Final  . Lymphs Relative 08/13/2023 34  Not Estab. %  Final  . Monocytes 08/13/2023 8  Not Estab. % Final  . Eos Relative 08/13/2023 2  Not Estab. % Final  . Basos Relative  08/13/2023 0  Not Estab. % Final  . Neutrophils Absolute 08/13/2023 4.4  1.4 - 7.0 x10E3/uL Final  . Lymphocytes Absolute 08/13/2023 2.7  0.7 - 3.1 x10E3/uL Final  . Monocytes Absolute 08/13/2023 0.6  0.1 - 0.9 x10E3/uL Final  . Eosinophils Absolute 08/13/2023 0.1  0.0 - 0.4 x10E3/uL Final  . Basophils Absolute 08/13/2023 0.0  0.0 - 0.2 x10E3/uL Final  . Immature Granulocytes 08/13/2023 0  Not Estab. % Final  . Immature Grans (Abs) 08/13/2023 0.0  0.0 - 0.1 x10E3/uL Final  . Vitamin B1 08/13/2023 165.7  66.5 - 200.0 nmol/L Final  Lab on 08/07/2023  Component Date Value Ref Range Status  . Cortisol 08/07/2023 1.4 (L)  6.2 - 19.4 ug/dL Final  . C-Peptide, Serum 08/07/2023 2.5  1.1 - 4.4 ng/mL Final  . Insulin, Fasting 08/07/2023 10.2  2.6 - 24.9 uIU/mL Final  . TSH 08/07/2023 5.410 (H)  0.450 - 4.50 uIU/mL Final  . Free T4 08/07/2023 1.58  0.82 - 1.77 ng/dL Final  . ACTH 93/81/7974 5.3 (L)  7.2 - 63.3 pg/mL Final  . Glucose 08/07/2023 68 (L)  70 - 99 mg/dL Final  . BUN 93/81/7974 12  6 - 20 mg/dL Final  . Creatinine 93/81/7974 0.76  0.57 - 1.00 mg/dL Final  . eGFR 93/81/7974 110  >59 mL/min/1.73 Final  . BUN/Creatinine Ratio 08/07/2023 16  9 - 23 Final  . Sodium  08/07/2023 140  134 - 144 mmol/L Final  . Potassium 08/07/2023 3.7  3.5 - 5.2 mmol/L Final  . Chloride 08/07/2023 106  96 - 106 mmol/L Final  . CO2 08/07/2023 20  20 - 29 mmol/L Final  . CALCIUM 08/07/2023 9.0  8.7 - 10.2 mg/dL Final  . Cortisol 93/81/7974 27.3 (H)  6.2 - 19.4 ug/dL Final  Admission on 95/85/7974, Discharged on 06/04/2023  Component Date Value Ref Range Status  . Glucose, POC 06/03/2023 79  70 - 99 mg/dL Final  . OPERATOR ID 95/85/7974 [id number]   Final  . INSTRUMENT ID 06/03/2023 [id number]   Final  . TnT-Gen5 (0hr) 06/03/2023 5  <14 ng/L Final  . WBC 06/03/2023 10.5  4.0 - 10.5 thou/mcL Final  . RBC 06/03/2023 4.86  3.93 - 5.22 million/mcL Final  . HGB 06/03/2023 14.2  11.2 - 15.7 gm/dL Final  . HCT 95/85/7974 41.7  34.1 - 44.9 % Final  . MCV 06/03/2023 85.8  79.4 - 94.8 fL Final  . MCH 06/03/2023 29.2  25.6 - 32.2 pg Final  . MCHC 06/03/2023 34.1  32.2 - 35.5 gm/dL Final  . Plt Ct 95/85/7974 228  150 - 400 thou/mcL Final  . RDW SD 06/03/2023 44.8  35.1 - 46.3 fL Final  . MPV 06/03/2023 11.2  9.4 - 12.4 fL Final  . NRBC% 06/03/2023 0.0  0.0 - 0.2 /100WBC Final  . Absolute NRBC Count 06/03/2023 0.00  0.00 - 0.01 thou/mcL Final  . NEUTROPHIL % 06/03/2023 70.1  % Final  . LYMPHOCYTE % 06/03/2023 22.3  % Final  . MONOCYTE % 06/03/2023 6.0  % Final  . Eosinophil % 06/03/2023 0.9  % Final  . BASOPHIL % 06/03/2023 0.3  % Final  . IG% 06/03/2023 0.4  % Final  . ABSOLUTE NEUTROPHIL COUNT 06/03/2023 7.38  1.50 - 7.50 thou/mcL Final  . ABSOLUTE LYMPHOCYTE COUNT 06/03/2023 2.35  1.00 -  4.50 thou/mcL Final  . Absolute Monocyte Count 06/03/2023 0.63  0.10 - 0.80 thou/mcL Final  . Absolute Eosinophil Count 06/03/2023 0.09  0.00 - 0.50 thou/mcL Final  . Absolute Basophil Count 06/03/2023 0.03  0.00 - 0.20 thou/mcL Final  . Absolute Immature Granulocyte Count 06/03/2023 0.04 (H)  0.00 - 0.03 thou/mcL  Final  . Na 06/03/2023 138  136 - 146 mmol/L Final  . Potassium 06/03/2023  3.5 (L)  3.7 - 5.4 mmol/L Final  . Cl 06/03/2023 105  97 - 108 mmol/L Final  . CO2 06/03/2023 19 (L)  20 - 32 mmol/L Final  . AGAP 06/03/2023 14  7 - 16 mmol/L Final  . Glucose 06/03/2023 93  65 - 99 mg/dL Final  . BUN 95/85/7974 13  6 - 20 mg/dL Final  . Creatinine 95/85/7974 0.75  0.57 - 1.00 mg/dL Final  . Ca 95/85/7974 9.5  8.7 - 10.2 mg/dL Final  . ALK PHOS 95/85/7974 81  25 - 150 U/L Final  . T Bili 06/03/2023 0.9  0.0 - 1.2 mg/dL Final  . Total Protein 06/03/2023 7.7  6.0 - 8.5 gm/dL Final  . Alb 95/85/7974 4.2  3.5 - 5.5 gm/dL Final  . GLOBULIN 95/85/7974 3.5  1.5 - 4.5 gm/dL Final  . ALBUMIN/GLOBULIN RATIO 06/03/2023 1.2  1.1 - 2.5 Final  . BUN/CREAT RATIO 06/03/2023 17.3  11.0 - 26.0 Final  . ALT 06/03/2023 19  0 - 40 U/L Final  . AST 06/03/2023 13  0 - 40 U/L Final  . eGFR 06/03/2023 112  >=60 mL/min/1.98m2 Final  . Mg 06/03/2023 2.1  1.6 - 2.6 mg/dL Final  . Beta HCG Qual 06/03/2023 Negative  Negative Final  . TnT-Gen5 (1hr) 06/03/2023 5  <14 ng/L Final  . Delta 1 Hour 06/03/2023 0  <5 ng/L Final  . TnT-Gen5 (3hr) 06/04/2023 5  <14 ng/L Final  . Delta 3 Hour 06/04/2023 0  <7 ng/L Final  . TSH 06/03/2023 0.72  0.45 - 4.50 mcIU/mL Final  Ancillary Procedure on 05/30/2023  Component Date Value Ref Range Status  . Blood Pressure Monitoring 05/30/2023 127/89   Final  . Pulse 05/30/2023 92   Final  Lab on 05/21/2023  Component Date Value Ref Range Status  . Vit D, 25-Hydroxy 05/21/2023 82.1  30.0 - 100.0 ng/mL Final  . TSH 05/21/2023 0.685  0.450 - 4.50 uIU/mL Final  . Insulin, Fasting 05/21/2023 62.2 (H)  2.6 - 24.9 uIU/mL Final  . C-Peptide, Serum 05/21/2023 8.9 (H)  1.1 - 4.4 ng/mL Final  Office Visit on 05/16/2023  Component Date Value Ref Range Status  . Ferritin 05/16/2023 9 (L)  15 - 150 ng/mL Final  . Vitamin B-12 05/16/2023 372  232 - 1,245 pg/mL Final  . Folate 05/16/2023 17.6  >3.0 ng/mL Final  . Vit D, 25-Hydroxy 05/16/2023 82.0  30.0 - 100.0 ng/mL Final  .  Hemoglobin A1c 05/16/2023 4.9  4.8 - 5.6 % Final  . Cholesterol, Total 05/16/2023 201 (H)  100 - 199 mg/dL Final  . Triglycerides 05/16/2023 91  0 - 149 mg/dL Final  . HDL 96/72/7974 43  >39 mg/dL Final  . VLDL Cholesterol Cal 05/16/2023 17  5 - 40 mg/dL Final  . LDL 96/72/7974 141 (H)  0 - 99 mg/dL Final  . Glucose 96/72/7974 83  70 - 99 mg/dL Final  . BUN 96/72/7974 11  6 - 20 mg/dL Final  . Creatinine 96/72/7974 0.85  0.57 - 1.00 mg/dL Final  .  eGFR 05/16/2023 97  >59 mL/min/1.73 Final  . BUN/Creatinine Ratio 05/16/2023 13  9 - 23 Final  . Sodium 05/16/2023 141  134 - 144 mmol/L Final  . Potassium 05/16/2023 4.4  3.5 - 5.2 mmol/L Final  . Chloride 05/16/2023 109 (H)  96 - 106 mmol/L Final  . CO2 05/16/2023 19 (L)  20 - 29 mmol/L Final  . CALCIUM 05/16/2023 8.8  8.7 - 10.2 mg/dL Final  . Total Protein 05/16/2023 6.7  6.0 - 8.5 g/dL Final  . Albumin, Serum 05/16/2023 4.3  4.0 - 5.0 g/dL Final  . Globulin, Total 05/16/2023 2.4  1.5 - 4.5 g/dL Final  . Total Bilirubin 05/16/2023 0.8  0.0 - 1.2 mg/dL Final  . Alkaline Phosphatase 05/16/2023 67  44 - 121 IU/L Final  . AST 05/16/2023 20  0 - 40 IU/L Final  . ALT (SGPT) 05/16/2023 37 (H)  0 - 32 IU/L Final  . WBC 05/16/2023 7.3  3.4 - 10.8 x10E3/uL Final  . RBC 05/16/2023 4.70  3.77 - 5.28 x10E6/uL Final  . Hemoglobin 05/16/2023 13.3  11.1 - 15.9 g/dL Final  . Hematocrit 96/72/7974 41.3  34.0 - 46.6 % Final  . MCV 05/16/2023 88  79 - 97 fL Final  . MCH 05/16/2023 28.3  26.6 - 33.0 pg Final  . MCHC 05/16/2023 32.2  31.5 - 35.7 g/dL Final  . RDW 96/72/7974 14.7  11.7 - 15.4 % Final  . Platelet Count 05/16/2023 204  150 - 450 x10E3/uL Final  . Neutrophils 05/16/2023 62  Not Estab. % Final  . Lymphs Relative 05/16/2023 30  Not Estab. % Final  . Monocytes 05/16/2023 5  Not Estab. % Final  . Eos Relative 05/16/2023 3  Not Estab. % Final  . Basos Relative  05/16/2023 0  Not Estab. % Final  . Neutrophils Absolute 05/16/2023 4.5  1.4 - 7.0  x10E3/uL Final  . Lymphocytes Absolute 05/16/2023 2.2  0.7 - 3.1 x10E3/uL Final  . Monocytes Absolute 05/16/2023 0.4  0.1 - 0.9 x10E3/uL Final  . Eosinophils Absolute 05/16/2023 0.2  0.0 - 0.4 x10E3/uL Final  . Basophils Absolute 05/16/2023 0.0  0.0 - 0.2 x10E3/uL Final  . Immature Granulocytes 05/16/2023 0  Not Estab. % Final  . Immature Grans (Abs) 05/16/2023 0.0  0.0 - 0.1 x10E3/uL Final  There may be more visits with results that are not included.      Assessment   28 y.o. female with PMHx as above who presents for follow-up.  Patient is continuing to have significant headaches, migraines, and occipital neuralgia with continued reports of frequent seizure-like activity.  Headaches are multifactorial.  There appears to be some cervical component, occipital neuralgia, and migraine.  Seizure episodes are occurring up to 11 times per day.  She does report more generalized tonic-clonic type activity.  She does endorse that she is amnestic to the events.  She is concerned that she could benefit into infection as she has had some fevers up to 102.  She is concerned that her shunt might be malfunctioning because she is having some sharp shooting pains along the course of the shunt when she rotates her head or has a massage in the shoulder region.  She was recently evaluated emergency department and by neurosurgery.  She has had a recent shunt series which was unremarkable and showed continuity of the shunt.  Patient's neurological examination remains unchanged.  Patient has had a recent EEG that was unremarkable.  Patient has also had a  recent CT head which was unremarkable.  She is currently on Lamictal for psychological purposes.  She is on topiramate for migraine prophylaxis.  She was started on Briviact when she began having frequent seizure-like episodes.  Patient does endorse that she has had some decrease in the intensity of her seizure-like episodes once beginning the antiseizure medications.  She  is also utilizing a THC edible that she feels is also decreasing her seizure-like episodes.  I recommend that we proceed with a long-term EEG to see if we are able to capture one of the episodes as there is a high suspicion for a nonepileptic etiology.  Unfortunately, the patient has found the referral to psychology and psychiatry to be cost prohibitive.  I continue to recommend that she try to find some help for her psychological difficulties.  She is currently on BuSpar for her anxiety.  She was wondering if alprazolam would be of benefit.  I feel that low-dose alprazolam as needed could be beneficial.  I recommend that she follow-up with her primary care for consideration of alprazolam.    Plan   1. Chronic migraine without aura without status migrainosus, not intractable (Primary) - SUMAtriptan succinate (IMITREX) 100 mg tablet  - tiZANidine (ZANAFLEX) 4 mg tablet; Take one tablet (4 mg dose) by mouth 3 (three) times a day.  Dispense: 90 tablet; Refill: 3 - AMB REFERRAL TO PHYSICAL THERAPY EVALUATION AND TREATMENT - TPI's as needed  2. Bilateral occipital neuralgia -Occipital nerve blocks as needed  3. IIH (idiopathic intracranial hypertension) -Stable -Recommended regular ophthalmology follow-up  4. Cervicalgia - tiZANidine (ZANAFLEX) 4 mg tablet; Take one tablet (4 mg dose) by mouth 3 (three) times a day.  Dispense: 90 tablet; Refill: 3 - AMB REFERRAL TO PHYSICAL THERAPY EVALUATION AND TREATMENT  5. Seizure-like activity (*) - CBC And Differential - Sedimentation Rate, Automated - C-Reactive Protein; Future - Ambulatory EEG Longterm Without Video; Future    Risks, benefits, and alternatives of the medications and treatment plan prescribed today were discussed, and patient expressed understanding and agreement with the plan.  All new prescription medications and changes in current prescription dosages were discussed with the patient, including patient education, medication name,  use, dosage, potential side effects, drug interactions, consequences of not using/taking and special instructions. Patient expressed understanding. none  Follow up in about 1 week (around 12/03/2023) for Occipital Nerve Block, Bilaterally.  Electronically Signed: Fonda FREDRIK Minus, DNP, AGNP-C Neurology Nurse Practitioner  [facility] [place] 11/26/2023 4:35 PM  *This note was dictated with voice recognition software. Inadvertently, similar sounding words can, sometimes, get transcribed incorrectly

## 2024-01-15 ENCOUNTER — Telehealth: Payer: Self-pay | Admitting: Allergy

## 2024-01-15 NOTE — Telephone Encounter (Signed)
 Left voicemail to give the office a call back to schedule Xolair  reapproval appointment.

## 2024-03-09 ENCOUNTER — Encounter (HOSPITAL_COMMUNITY): Payer: Self-pay

## 2024-03-09 ENCOUNTER — Other Ambulatory Visit: Payer: Self-pay

## 2024-03-09 ENCOUNTER — Emergency Department (HOSPITAL_COMMUNITY)

## 2024-03-09 ENCOUNTER — Emergency Department (HOSPITAL_COMMUNITY)
Admission: EM | Admit: 2024-03-09 | Discharge: 2024-03-09 | Disposition: A | Discharge status: Home / Self Care | Attending: Emergency Medicine | Admitting: Emergency Medicine

## 2024-03-09 DIAGNOSIS — I10 Essential (primary) hypertension: Secondary | ICD-10-CM | POA: Diagnosis not present

## 2024-03-09 DIAGNOSIS — Z7951 Long term (current) use of inhaled steroids: Secondary | ICD-10-CM | POA: Diagnosis not present

## 2024-03-09 DIAGNOSIS — J45909 Unspecified asthma, uncomplicated: Secondary | ICD-10-CM | POA: Diagnosis not present

## 2024-03-09 DIAGNOSIS — R1030 Lower abdominal pain, unspecified: Secondary | ICD-10-CM | POA: Diagnosis present

## 2024-03-09 DIAGNOSIS — R1013 Epigastric pain: Secondary | ICD-10-CM | POA: Insufficient documentation

## 2024-03-09 HISTORY — DX: Essential (primary) hypertension: I10

## 2024-03-09 LAB — CBC
HCT: 39.4 % (ref 36.0–46.0)
Hemoglobin: 13.5 g/dL (ref 12.0–15.0)
MCH: 29.2 pg (ref 26.0–34.0)
MCHC: 34.3 g/dL (ref 30.0–36.0)
MCV: 85.1 fL (ref 80.0–100.0)
Platelets: 211 K/uL (ref 150–400)
RBC: 4.63 MIL/uL (ref 3.87–5.11)
RDW: 13.3 % (ref 11.5–15.5)
WBC: 9.7 K/uL (ref 4.0–10.5)
nRBC: 0 % (ref 0.0–0.2)

## 2024-03-09 LAB — URINALYSIS, ROUTINE W REFLEX MICROSCOPIC
Bilirubin Urine: NEGATIVE
Glucose, UA: NEGATIVE mg/dL
Hgb urine dipstick: NEGATIVE
Ketones, ur: NEGATIVE mg/dL
Leukocytes,Ua: NEGATIVE
Nitrite: NEGATIVE
Protein, ur: NEGATIVE mg/dL
Specific Gravity, Urine: 1.009 (ref 1.005–1.030)
pH: 8 (ref 5.0–8.0)

## 2024-03-09 LAB — COMPREHENSIVE METABOLIC PANEL WITH GFR
ALT: 25 U/L (ref 0–44)
AST: 18 U/L (ref 15–41)
Albumin: 4.3 g/dL (ref 3.5–5.0)
Alkaline Phosphatase: 72 U/L (ref 38–126)
Anion gap: 14 (ref 5–15)
BUN: 13 mg/dL (ref 6–20)
CO2: 19 mmol/L — ABNORMAL LOW (ref 22–32)
Calcium: 8.8 mg/dL — ABNORMAL LOW (ref 8.9–10.3)
Chloride: 106 mmol/L (ref 98–111)
Creatinine, Ser: 0.67 mg/dL (ref 0.44–1.00)
GFR, Estimated: >60 mL/min
Glucose, Bld: 96 mg/dL (ref 70–99)
Potassium: 3.4 mmol/L — ABNORMAL LOW (ref 3.5–5.1)
Sodium: 139 mmol/L (ref 135–145)
Total Bilirubin: 1.2 mg/dL (ref 0.0–1.2)
Total Protein: 6.7 g/dL (ref 6.5–8.1)

## 2024-03-09 LAB — POC URINE PREG, ED: Preg Test, Ur: NEGATIVE

## 2024-03-09 LAB — LIPASE, BLOOD: Lipase: 16 U/L (ref 11–51)

## 2024-03-09 LAB — GROUP A STREP BY PCR: Group A Strep by PCR: NOT DETECTED

## 2024-03-09 MED ORDER — HYDROMORPHONE HCL 1 MG/ML IJ SOLN
0.5000 mg | Freq: Once | INTRAMUSCULAR | Status: AC
  Administered 2024-03-09: 0.5 mg via INTRAVENOUS
  Filled 2024-03-09: qty 0.5

## 2024-03-09 MED ORDER — METOCLOPRAMIDE HCL 5 MG/ML IJ SOLN
10.0000 mg | Freq: Once | INTRAMUSCULAR | Status: AC
  Administered 2024-03-09: 10 mg via INTRAVENOUS
  Filled 2024-03-09: qty 2

## 2024-03-09 MED ORDER — IOHEXOL 300 MG/ML  SOLN
100.0000 mL | Freq: Once | INTRAMUSCULAR | Status: AC | PRN
  Administered 2024-03-09: 100 mL via INTRAVENOUS

## 2024-03-09 NOTE — ED Triage Notes (Signed)
 Pt c/o abd mid pain since 5 am. Pt states it feels like something opened or tore inside.

## 2024-03-09 NOTE — ED Notes (Signed)
 Pt verbalized her daughter strep throat at this time.

## 2024-03-09 NOTE — Discharge Instructions (Signed)
 Your workup was reassuring today.  No clear cause of the pain was found.  Follow-up with your doctors as needed

## 2024-03-09 NOTE — ED Provider Notes (Signed)
 " [place] EMERGENCY DEPARTMENT AT [facility] Provider Note   CSN: [id number] Arrival date & time: 03/09/24  9283     Patient presents with: Abdominal Pain   Jessica Andersen is a 29 y.o. female.    Abdominal Pain Patient presents with lower abdominal pain.  Began around 5 this morning.  Severe.  Feels that something is tearing.  States she has had numerous surgeries of her abdomen.  Has VP shunt.  Has had a hernia.  States that hurts from her urethra up to her upper chest. Of note patient also has a son with strep.     Past Medical History:  Diagnosis Date   Asthma    Eczema    Hypertension    Urticaria    Past Surgical History:  Procedure Laterality Date   BARIATRIC SURGERY       Prior to Admission medications  Medication Sig Start Date End Date Taking? Authorizing Provider  Albuterol -Budesonide (AIRSUPRA ) 90-80 MCG/ACT AERO Inhale 2 puffs into the lungs every 4 (four) hours as needed (coughing, wheezing, chest tightness). Do not exceed 12 puffs in 24 hours. 04/25/23   Luke Orlan HERO, DO  Blood Glucose Monitoring Suppl (ACCU-CHEK GUIDE) w/Device KIT Use as directed by physician to monitor blood sugars 04/26/22   [provider]  calcium acetate (PHOSLO) 667 MG capsule Take by mouth.    [provider]  clonazePAM  (KLONOPIN ) 0.5 MG disintegrating tablet Take 1 tablet (0.5 mg total) by mouth 2 (two) times daily as needed (anxiety). 03/21/20 04/25/23  Pucilowski, Olgierd A, MD  Docusate Sodium (DSS) 100 MG CAPS Take by mouth.    [provider]  FLUoxetine (PROZAC) 40 MG capsule Take by mouth. 05/18/22   [provider]  glucose blood test strip Use to accu-chek guide test strips to monitor blood glucose 4 times daily 04/26/22   [provider]  hydrOXYzine (VISTARIL) 25 MG capsule Take by mouth. 08/28/22   [provider]  ibuprofen (ADVIL) 800 MG tablet Take by mouth. 03/01/23   [provider]  Magnesium 250 MG TABS  Take 1 tablet by mouth daily.    [provider]  methocarbamol (ROBAXIN) 500 MG tablet Take 500 mg by mouth 3 (three) times daily. 04/22/23   [provider]  metoCLOPramide  (REGLAN ) 10 MG tablet Take by mouth. 06/28/21   [provider]  metroNIDAZOLE (FLAGYL) 500 MG tablet Take 500 mg by mouth 2 (two) times daily. 07/24/22   [provider]  norethindrone (MICRONOR) 0.35 MG tablet Take 1 tablet by mouth daily. 03/20/23   [provider]  ondansetron (ZOFRAN) 4 MG tablet Take by mouth. 01/28/23   [provider]  Oxcarbazepine  (TRILEPTAL ) 300 MG tablet Take 1 tablet (300 mg total) by mouth 2 (two) times daily. 05/20/20 08/18/20  Pucilowski, Olgierd A, MD  Oxycodone HCl 10 MG TABS SMARTSIG:1 Tablet(s) By Mouth 6 Times Daily PRN 04/09/23   [provider]  phentermine (ADIPEX-P) 37.5 MG tablet Break the tablet in half. Take one half tablet 30 minutes before breakfast and the remaining half tablet 30 minutes before lunch. 08/14/22   [provider]  pregabalin (LYRICA) 50 MG capsule Take by mouth. 03/04/23   [provider]  rizatriptan (MAXALT) 10 MG tablet Take by mouth. 03/14/23   [provider]  SUMAtriptan (IMITREX) 100 MG tablet Take by mouth. 03/22/23   [provider]  tiZANidine (ZANAFLEX) 4 MG tablet Take 8 mg by mouth 3 (  three) times daily as needed.    [provider]  topiramate (TOPAMAX) 25 MG tablet Take by mouth. 08/16/22   [provider]  traZODone (DESYREL) 50 MG tablet Take 50 mg by mouth at bedtime. 02/15/22   [provider]  Vitamin D, Ergocalciferol, 50000 units CAPS Take 1 capsule by mouth 2 (two) times a week.    [provider]  XOLAIR 150 MG/ML prefilled syringe Inject into the skin. 10/18/22   [provider]    Allergies: Other, Baby wipes, Dexamethasone, Icy hot, Penicillins, Wound dressings, and Lisinopril    Review of Systems   Gastrointestinal:  Positive for abdominal pain.    Updated Vital Signs BP 131/82   Pulse 92   Temp 100.3 F (37.9 C) (Oral)   Resp 15   Ht 5' 2 (1.575 m)   Wt 74.8 kg   LMP 02/11/2024 (Approximate)   SpO2 97%   BMI 30.18 kg/m   Physical Exam Vitals and nursing note reviewed.  Abdominal:     Tenderness: There is abdominal tenderness.  Neurological:     Mental Status: She is alert.     (all labs ordered are listed, but only abnormal results are displayed) Labs Reviewed  COMPREHENSIVE METABOLIC PANEL WITH GFR - Abnormal; Notable for the following components:      Result Value   Potassium 3.4 (*)    CO2 19 (*)    Calcium 8.8 (*)    All other components within normal limits  URINALYSIS, ROUTINE W REFLEX MICROSCOPIC - Abnormal; Notable for the following components:   APPearance HAZY (*)    All other components within normal limits  GROUP A STREP BY PCR  LIPASE, BLOOD  CBC  POC URINE PREG, ED    EKG: None  Radiology: CT ABDOMEN PELVIS W CONTRAST Result Date: 03/09/2024 CLINICAL DATA:  Abdominal pain, acute, nonlocalized Abdominal pain since this morning. EXAM: CT ABDOMEN AND PELVIS WITH CONTRAST TECHNIQUE: Multidetector CT imaging of the abdomen and pelvis was performed using the standard protocol following bolus administration of intravenous contrast. RADIATION DOSE REDUCTION: This exam was performed according to the departmental dose-optimization program which includes automated exposure control, adjustment of the mA and/or kV according to patient size and/or use of iterative reconstruction technique. CONTRAST:  OMNIPAQUE  IOHEXOL  300 MG/ML  SOLN COMPARISON:  None Available. FINDINGS: Lower chest: Clear lung bases. No significant pleural or pericardial effusion. Hepatobiliary: The liver has a non cirrhotic morphology without suspicious focal abnormality. No evidence of gallstones, gallbladder wall thickening or biliary dilatation. Pancreas: Unremarkable. No  pancreatic ductal dilatation or surrounding inflammatory changes. Spleen: Normal in size without focal abnormality. Adrenals/Urinary Tract: Both adrenal glands appear normal. Small nonobstructing left renal calculi, measuring up to 6 mm in the lower pole. No evidence of ureteral calculus, hydronephrosis or perinephric soft tissue stranding. Tiny renal cysts bilaterally for which no specific follow-up imaging is recommended. The bladder appears unremarkable for its degree of distention. Stomach/Bowel: No enteric contrast administered. 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 The stomach appears unremarkable for its degree of distension. No evidence of bowel wall thickening, distention or surrounding inflammatory change. No evidence of appendicitis. Vascular/Lymphatic: There are no enlarged abdominal or pelvic lymph nodes. No significant vascular findings. Reproductive: The uterus and ovaries appear unremarkable. No suspicious adnexal findings. Other: Small amount of free pelvic fluid, within physiologic limits. Ventricular peritoneal shunt tubing extends into the right lateral mid abdomen. No surrounding focal fluid collection. No pneumoperitoneum. Musculoskeletal: No acute  or significant osseous findings. IMPRESSION: 1. No acute findings or explanation for the patient's symptoms. 2. Nonobstructing left renal calculi. No evidence of ureteral calculus or hydronephrosis. 3. 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 Electronically Signed   By: Elsie Perone M.D.   On: 03/09/2024 10:22     Procedures   Medications Ordered in the ED  HYDROmorphone  (DILAUDID ) injection 0.5 mg (0.5 mg Intravenous Given 03/09/24 0811)  metoCLOPramide  (REGLAN ) injection 10 mg (10 mg Intravenous Given 03/09/24 0823)  iohexol  (OMNIPAQUE ) 300 MG/ML solution 100 mL (100 mLs Intravenous Contrast Given 03/09/24 0929)                                    Medical Decision  Making Amount and/or Complexity of Data Reviewed Labs: ordered. Radiology: ordered.  Risk Prescription drug management.   Patient abdominal pain.  Diffuse but also been in the lower abdomen.  Began this morning around 5 AM.  Differential diagnoses longed but does include cause of the obstruction and infection.  Also has had a previous renal stone.  Blood work reassuring.  Urine does not show infection.  However with continued tenderness on reexam will get a CT scan to evaluate.  Also has VP shunt.  CT scan done and no clear cause of pain.  Feeling somewhat better.  Do not think we need extensive workup at this time besides what is already been done.  Negative strep test.  Appears stable for discharge home.      Final diagnoses:  Epigastric pain    ED Discharge Orders     None          Patsey Lot, MD 03/09/24 1511  "

## 2024-03-09 NOTE — ED Notes (Signed)
 Pt requesting pain meds for home. EDP aware.
